# Patient Record
Sex: Male | Born: 1980 | Race: Black or African American | Hispanic: No | Smoking: Current every day smoker
Health system: Southern US, Community
[De-identification: ages and names within clinical notes are randomized; demographics above are authoritative.]

## PROBLEM LIST (undated history)

## (undated) DIAGNOSIS — S82202B Unspecified fracture of shaft of left tibia, initial encounter for open fracture type I or II: Secondary | ICD-10-CM

## (undated) DIAGNOSIS — I10 Essential (primary) hypertension: Secondary | ICD-10-CM

## (undated) DIAGNOSIS — W3400XA Accidental discharge from unspecified firearms or gun, initial encounter: Secondary | ICD-10-CM

## (undated) DIAGNOSIS — S42412A Displaced simple supracondylar fracture without intercondylar fracture of left humerus, initial encounter for closed fracture: Secondary | ICD-10-CM

## (undated) DIAGNOSIS — T07XXXA Unspecified multiple injuries, initial encounter: Secondary | ICD-10-CM

---
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
## 2006-08-07 ENCOUNTER — Emergency Department (HOSPITAL_COMMUNITY): Admission: EM | Admit: 2006-08-07 | Discharge: 2006-08-07 | Payer: Self-pay | Admitting: Emergency Medicine

## 2007-01-27 ENCOUNTER — Emergency Department (HOSPITAL_COMMUNITY): Admission: EM | Admit: 2007-01-27 | Discharge: 2007-01-27 | Payer: Self-pay | Admitting: Family Medicine

## 2009-11-28 ENCOUNTER — Emergency Department (HOSPITAL_COMMUNITY): Admission: EM | Admit: 2009-11-28 | Discharge: 2009-11-28 | Payer: Self-pay | Admitting: Family Medicine

## 2010-04-06 LAB — POCT I-STAT, CHEM 8
Creatinine, Ser: 1 mg/dL (ref 0.4–1.2)
Glucose, Bld: 108 mg/dL — ABNORMAL HIGH (ref 70–99)
HCT: 46 % (ref 36.0–46.0)
Hemoglobin: 15.6 g/dL — ABNORMAL HIGH (ref 12.0–15.0)
Sodium: 141 mEq/L (ref 135–145)
TCO2: 26 mmol/L (ref 0–100)

## 2010-04-06 LAB — POCT URINALYSIS DIPSTICK
Bilirubin Urine: NEGATIVE
Nitrite: NEGATIVE
Protein, ur: NEGATIVE mg/dL
pH: 7 (ref 5.0–8.0)

## 2010-05-24 ENCOUNTER — Emergency Department (HOSPITAL_COMMUNITY)
Admission: EM | Admit: 2010-05-24 | Discharge: 2010-05-24 | Disposition: A | Discharge status: Home / Self Care | Payer: No Typology Code available for payment source | Attending: Emergency Medicine | Admitting: Emergency Medicine

## 2010-05-24 DIAGNOSIS — M542 Cervicalgia: Secondary | ICD-10-CM | POA: Insufficient documentation

## 2010-05-24 DIAGNOSIS — M25519 Pain in unspecified shoulder: Secondary | ICD-10-CM | POA: Insufficient documentation

## 2010-05-24 DIAGNOSIS — S139XXA Sprain of joints and ligaments of unspecified parts of neck, initial encounter: Secondary | ICD-10-CM | POA: Insufficient documentation

## 2010-05-27 ENCOUNTER — Emergency Department (HOSPITAL_COMMUNITY): Payer: No Typology Code available for payment source

## 2010-05-27 ENCOUNTER — Emergency Department (HOSPITAL_COMMUNITY)
Admission: EM | Admit: 2010-05-27 | Discharge: 2010-05-27 | Disposition: A | Discharge status: Home / Self Care | Payer: No Typology Code available for payment source | Attending: Emergency Medicine | Admitting: Emergency Medicine

## 2010-05-27 DIAGNOSIS — M542 Cervicalgia: Secondary | ICD-10-CM | POA: Insufficient documentation

## 2010-05-27 DIAGNOSIS — R51 Headache: Secondary | ICD-10-CM | POA: Insufficient documentation

## 2010-05-27 DIAGNOSIS — S139XXA Sprain of joints and ligaments of unspecified parts of neck, initial encounter: Secondary | ICD-10-CM | POA: Insufficient documentation

## 2010-05-27 DIAGNOSIS — M545 Low back pain, unspecified: Secondary | ICD-10-CM | POA: Insufficient documentation

## 2010-05-27 DIAGNOSIS — M546 Pain in thoracic spine: Secondary | ICD-10-CM | POA: Insufficient documentation

## 2010-05-27 DIAGNOSIS — S239XXA Sprain of unspecified parts of thorax, initial encounter: Secondary | ICD-10-CM | POA: Insufficient documentation

## 2010-05-27 DIAGNOSIS — S335XXA Sprain of ligaments of lumbar spine, initial encounter: Secondary | ICD-10-CM | POA: Insufficient documentation

## 2010-06-03 ENCOUNTER — Emergency Department (HOSPITAL_COMMUNITY)
Admission: EM | Admit: 2010-06-03 | Discharge: 2010-06-03 | Disposition: A | Discharge status: Home / Self Care | Payer: No Typology Code available for payment source | Attending: Emergency Medicine | Admitting: Emergency Medicine

## 2010-06-03 DIAGNOSIS — Y929 Unspecified place or not applicable: Secondary | ICD-10-CM | POA: Insufficient documentation

## 2010-06-03 DIAGNOSIS — M542 Cervicalgia: Secondary | ICD-10-CM | POA: Insufficient documentation

## 2010-06-03 DIAGNOSIS — R51 Headache: Secondary | ICD-10-CM | POA: Insufficient documentation

## 2010-06-03 DIAGNOSIS — M546 Pain in thoracic spine: Secondary | ICD-10-CM | POA: Insufficient documentation

## 2010-06-03 DIAGNOSIS — IMO0001 Reserved for inherently not codable concepts without codable children: Secondary | ICD-10-CM | POA: Insufficient documentation

## 2010-07-06 ENCOUNTER — Emergency Department (HOSPITAL_COMMUNITY)
Admission: EM | Admit: 2010-07-06 | Discharge: 2010-07-06 | Disposition: A | Discharge status: Home / Self Care | Payer: Self-pay | Attending: Emergency Medicine | Admitting: Emergency Medicine

## 2010-07-06 ENCOUNTER — Emergency Department (HOSPITAL_COMMUNITY): Payer: Self-pay

## 2010-07-06 DIAGNOSIS — Y92009 Unspecified place in unspecified non-institutional (private) residence as the place of occurrence of the external cause: Secondary | ICD-10-CM | POA: Insufficient documentation

## 2010-07-06 DIAGNOSIS — IMO0001 Reserved for inherently not codable concepts without codable children: Secondary | ICD-10-CM | POA: Insufficient documentation

## 2010-07-06 DIAGNOSIS — S335XXA Sprain of ligaments of lumbar spine, initial encounter: Secondary | ICD-10-CM | POA: Insufficient documentation

## 2010-07-06 DIAGNOSIS — R51 Headache: Secondary | ICD-10-CM | POA: Insufficient documentation

## 2010-07-06 DIAGNOSIS — J3489 Other specified disorders of nose and nasal sinuses: Secondary | ICD-10-CM | POA: Insufficient documentation

## 2010-07-06 DIAGNOSIS — M545 Low back pain, unspecified: Secondary | ICD-10-CM | POA: Insufficient documentation

## 2010-07-06 DIAGNOSIS — R110 Nausea: Secondary | ICD-10-CM | POA: Insufficient documentation

## 2010-07-19 ENCOUNTER — Emergency Department (HOSPITAL_COMMUNITY)
Admission: EM | Admit: 2010-07-19 | Discharge: 2010-07-19 | Disposition: A | Discharge status: Home / Self Care | Payer: Self-pay | Attending: Emergency Medicine | Admitting: Emergency Medicine

## 2010-07-19 DIAGNOSIS — M542 Cervicalgia: Secondary | ICD-10-CM | POA: Insufficient documentation

## 2010-07-19 DIAGNOSIS — W08XXXA Fall from other furniture, initial encounter: Secondary | ICD-10-CM | POA: Insufficient documentation

## 2010-07-19 DIAGNOSIS — Y92009 Unspecified place in unspecified non-institutional (private) residence as the place of occurrence of the external cause: Secondary | ICD-10-CM | POA: Insufficient documentation

## 2010-07-19 DIAGNOSIS — M546 Pain in thoracic spine: Secondary | ICD-10-CM | POA: Insufficient documentation

## 2010-10-14 LAB — GC/CHLAMYDIA PROBE AMP, GENITAL: GC Probe Amp, Genital: NEGATIVE

## 2012-01-07 ENCOUNTER — Emergency Department (HOSPITAL_COMMUNITY): Payer: Medicaid Other

## 2012-01-07 ENCOUNTER — Inpatient Hospital Stay (HOSPITAL_COMMUNITY): Payer: Medicaid Other

## 2012-01-07 ENCOUNTER — Encounter (HOSPITAL_COMMUNITY): Admission: EM | Disposition: A | Discharge status: Home / Self Care | Payer: Self-pay | Source: Home / Self Care

## 2012-01-07 ENCOUNTER — Encounter (HOSPITAL_COMMUNITY): Payer: Self-pay | Admitting: Emergency Medicine

## 2012-01-07 ENCOUNTER — Encounter (HOSPITAL_COMMUNITY): Payer: Self-pay | Admitting: Orthopedic Surgery

## 2012-01-07 ENCOUNTER — Emergency Department (HOSPITAL_COMMUNITY): Payer: Medicaid Other | Admitting: Certified Registered Nurse Anesthetist

## 2012-01-07 ENCOUNTER — Encounter (HOSPITAL_COMMUNITY): Payer: Self-pay | Admitting: Certified Registered Nurse Anesthetist

## 2012-01-07 ENCOUNTER — Inpatient Hospital Stay (HOSPITAL_COMMUNITY)
Admission: EM | Admit: 2012-01-07 | Discharge: 2012-01-16 | DRG: 958 | Disposition: A | Discharge status: Home / Self Care | Payer: Medicaid Other | Attending: General Surgery | Admitting: General Surgery

## 2012-01-07 DIAGNOSIS — S41132A Puncture wound without foreign body of left upper arm, initial encounter: Secondary | ICD-10-CM

## 2012-01-07 DIAGNOSIS — S82209B Unspecified fracture of shaft of unspecified tibia, initial encounter for open fracture type I or II: Principal | ICD-10-CM | POA: Diagnosis present

## 2012-01-07 DIAGNOSIS — S52209A Unspecified fracture of shaft of unspecified ulna, initial encounter for closed fracture: Secondary | ICD-10-CM

## 2012-01-07 DIAGNOSIS — S42412A Displaced simple supracondylar fracture without intercondylar fracture of left humerus, initial encounter for closed fracture: Secondary | ICD-10-CM

## 2012-01-07 DIAGNOSIS — S71009A Unspecified open wound, unspecified hip, initial encounter: Secondary | ICD-10-CM | POA: Diagnosis present

## 2012-01-07 DIAGNOSIS — S82409A Unspecified fracture of shaft of unspecified fibula, initial encounter for closed fracture: Secondary | ICD-10-CM

## 2012-01-07 DIAGNOSIS — W3400XA Accidental discharge from unspecified firearms or gun, initial encounter: Secondary | ICD-10-CM

## 2012-01-07 DIAGNOSIS — S82209A Unspecified fracture of shaft of unspecified tibia, initial encounter for closed fracture: Secondary | ICD-10-CM

## 2012-01-07 DIAGNOSIS — S31109A Unspecified open wound of abdominal wall, unspecified quadrant without penetration into peritoneal cavity, initial encounter: Secondary | ICD-10-CM | POA: Diagnosis present

## 2012-01-07 DIAGNOSIS — D62 Acute posthemorrhagic anemia: Secondary | ICD-10-CM | POA: Diagnosis present

## 2012-01-07 DIAGNOSIS — S81832A Puncture wound without foreign body, left lower leg, initial encounter: Secondary | ICD-10-CM

## 2012-01-07 DIAGNOSIS — I10 Essential (primary) hypertension: Secondary | ICD-10-CM | POA: Diagnosis present

## 2012-01-07 DIAGNOSIS — S21339A Puncture wound without foreign body of unspecified front wall of thorax with penetration into thoracic cavity, initial encounter: Secondary | ICD-10-CM

## 2012-01-07 DIAGNOSIS — R209 Unspecified disturbances of skin sensation: Secondary | ICD-10-CM | POA: Diagnosis present

## 2012-01-07 DIAGNOSIS — S82899B Other fracture of unspecified lower leg, initial encounter for open fracture type I or II: Secondary | ICD-10-CM | POA: Diagnosis present

## 2012-01-07 DIAGNOSIS — S71109A Unspecified open wound, unspecified thigh, initial encounter: Secondary | ICD-10-CM | POA: Diagnosis present

## 2012-01-07 DIAGNOSIS — S8253XB Displaced fracture of medial malleolus of unspecified tibia, initial encounter for open fracture type I or II: Secondary | ICD-10-CM | POA: Diagnosis present

## 2012-01-07 DIAGNOSIS — S42402B Unspecified fracture of lower end of left humerus, initial encounter for open fracture: Secondary | ICD-10-CM

## 2012-01-07 DIAGNOSIS — T07XXXA Unspecified multiple injuries, initial encounter: Secondary | ICD-10-CM

## 2012-01-07 DIAGNOSIS — S21109A Unspecified open wound of unspecified front wall of thorax without penetration into thoracic cavity, initial encounter: Secondary | ICD-10-CM | POA: Diagnosis present

## 2012-01-07 DIAGNOSIS — T79A29A Traumatic compartment syndrome of unspecified lower extremity, initial encounter: Secondary | ICD-10-CM | POA: Diagnosis present

## 2012-01-07 DIAGNOSIS — S31030A Puncture wound without foreign body of lower back and pelvis without penetration into retroperitoneum, initial encounter: Secondary | ICD-10-CM

## 2012-01-07 DIAGNOSIS — S82202B Unspecified fracture of shaft of left tibia, initial encounter for open fracture type I or II: Secondary | ICD-10-CM

## 2012-01-07 DIAGNOSIS — S85909A Unspecified injury of unspecified blood vessel at lower leg level, unspecified leg, initial encounter: Secondary | ICD-10-CM

## 2012-01-07 DIAGNOSIS — S42413B Displaced simple supracondylar fracture without intercondylar fracture of unspecified humerus, initial encounter for open fracture: Secondary | ICD-10-CM | POA: Diagnosis present

## 2012-01-07 DIAGNOSIS — S82109B Unspecified fracture of upper end of unspecified tibia, initial encounter for open fracture type I or II: Secondary | ICD-10-CM | POA: Diagnosis present

## 2012-01-07 DIAGNOSIS — S3130XA Unspecified open wound of scrotum and testes, initial encounter: Secondary | ICD-10-CM | POA: Diagnosis present

## 2012-01-07 HISTORY — DX: Displaced simple supracondylar fracture without intercondylar fracture of left humerus, initial encounter for closed fracture: S42.412A

## 2012-01-07 HISTORY — PX: EXTERNAL FIXATION LEG: SHX1549

## 2012-01-07 HISTORY — DX: Unspecified fracture of shaft of left tibia, initial encounter for open fracture type I or II: S82.202B

## 2012-01-07 HISTORY — PX: INTRAOPERATIVE ARTERIOGRAM: SHX5157

## 2012-01-07 HISTORY — DX: Unspecified multiple injuries, initial encounter: T07.XXXA

## 2012-01-07 HISTORY — DX: Accidental discharge from unspecified firearms or gun, initial encounter: W34.00XA

## 2012-01-07 HISTORY — PX: I&D EXTREMITY: SHX5045

## 2012-01-07 HISTORY — PX: CAST APPLICATION: SHX380

## 2012-01-07 HISTORY — DX: Essential (primary) hypertension: I10

## 2012-01-07 LAB — COMPREHENSIVE METABOLIC PANEL
ALT: 10 U/L (ref 0–53)
AST: 19 U/L (ref 0–37)
Albumin: 4.1 g/dL (ref 3.5–5.2)
CO2: 16 mEq/L — ABNORMAL LOW (ref 19–32)
Chloride: 104 mEq/L (ref 96–112)
Creatinine, Ser: 1.03 mg/dL (ref 0.50–1.35)
GFR calc non Af Amer: >90 mL/min (ref >90)
Sodium: 139 mEq/L (ref 135–145)
Total Bilirubin: 0.3 mg/dL (ref 0.3–1.2)

## 2012-01-07 LAB — CBC
Hemoglobin: 13.7 g/dL (ref 13.0–17.0)
MCH: 27.8 pg (ref 26.0–34.0)
MCHC: 36.1 g/dL — ABNORMAL HIGH (ref 30.0–36.0)
MCV: 77 fL — ABNORMAL LOW (ref 78.0–100.0)
Platelets: 246 10*3/uL (ref 150–400)
RBC: 4.92 MIL/uL (ref 4.22–5.81)

## 2012-01-07 LAB — URINALYSIS, MICROSCOPIC ONLY
Bilirubin Urine: NEGATIVE
Glucose, UA: NEGATIVE mg/dL
Ketones, ur: NEGATIVE mg/dL
Protein, ur: NEGATIVE mg/dL
pH: 7 (ref 5.0–8.0)

## 2012-01-07 LAB — PROTIME-INR: INR: 1.09 (ref 0.00–1.49)

## 2012-01-07 LAB — POCT I-STAT, CHEM 8
BUN: 13 mg/dL (ref 6–23)
Chloride: 108 mEq/L (ref 96–112)
Creatinine, Ser: 0.9 mg/dL (ref 0.50–1.35)
Sodium: 141 mEq/L (ref 135–145)

## 2012-01-07 LAB — ABO/RH: ABO/RH(D): O POS

## 2012-01-07 LAB — CG4 I-STAT (LACTIC ACID): Lactic Acid, Venous: 6.84 mmol/L — ABNORMAL HIGH (ref 0.5–2.2)

## 2012-01-07 LAB — PREPARE RBC (CROSSMATCH)

## 2012-01-07 SURGERY — INTRA OPERATIVE ARTERIOGRAM
Anesthesia: General

## 2012-01-07 SURGERY — EXTERNAL FIXATION, LOWER EXTREMITY
Anesthesia: General | Site: Leg Lower | Laterality: Right | Wound class: Dirty or Infected

## 2012-01-07 MED ORDER — IODIXANOL 320 MG/ML IV SOLN
INTRAVENOUS | Status: DC | PRN
  Administered 2012-01-07: 60 mL via INTRAVENOUS

## 2012-01-07 MED ORDER — LIDOCAINE HCL (CARDIAC) 20 MG/ML IV SOLN
INTRAVENOUS | Status: DC | PRN
  Administered 2012-01-07: 100 mg via INTRAVENOUS

## 2012-01-07 MED ORDER — IOHEXOL 300 MG/ML  SOLN
100.0000 mL | Freq: Once | INTRAMUSCULAR | Status: AC | PRN
  Administered 2012-01-07: 100 mL via INTRAVENOUS

## 2012-01-07 MED ORDER — TETANUS-DIPHTH-ACELL PERTUSSIS 5-2.5-18.5 LF-MCG/0.5 IM SUSP
0.5000 mL | Freq: Once | INTRAMUSCULAR | Status: AC
  Administered 2012-01-07: 0.5 mL via INTRAMUSCULAR

## 2012-01-07 MED ORDER — PANTOPRAZOLE SODIUM 40 MG IV SOLR
40.0000 mg | Freq: Every day | INTRAVENOUS | Status: DC
  Administered 2012-01-07 – 2012-01-08 (×2): 40 mg via INTRAVENOUS
  Filled 2012-01-07 (×4): qty 40

## 2012-01-07 MED ORDER — METOCLOPRAMIDE HCL 5 MG/ML IJ SOLN: 10.0000 mg | Freq: Once | INTRAMUSCULAR | Status: DC | PRN

## 2012-01-07 MED ORDER — DEXTROSE 5 % IV SOLN
INTRAVENOUS | Status: DC | PRN
  Administered 2012-01-07: 09:00:00 via INTRAVENOUS

## 2012-01-07 MED ORDER — ACETAMINOPHEN 325 MG PO TABS: 650.0000 mg | ORAL_TABLET | ORAL | Status: DC | PRN

## 2012-01-07 MED ORDER — FENTANYL CITRATE 0.05 MG/ML IJ SOLN
50.0000 ug | Freq: Once | INTRAMUSCULAR | Status: DC
  Filled 2012-01-07: qty 2

## 2012-01-07 MED ORDER — LACTATED RINGERS IV SOLN
INTRAVENOUS | Status: DC | PRN
  Administered 2012-01-07 (×2): via INTRAVENOUS

## 2012-01-07 MED ORDER — TETANUS-DIPHTH-ACELL PERTUSSIS 5-2.5-18.5 LF-MCG/0.5 IM SUSP
INTRAMUSCULAR | Status: AC
  Administered 2012-01-07: 0.5 mL via INTRAMUSCULAR
  Filled 2012-01-07: qty 0.5

## 2012-01-07 MED ORDER — CEFAZOLIN SODIUM 1-5 GM-% IV SOLN
1.0000 g | Freq: Three times a day (TID) | INTRAVENOUS | Status: AC
  Administered 2012-01-07 – 2012-01-08 (×3): 1 g via INTRAVENOUS
  Filled 2012-01-07 (×4): qty 50

## 2012-01-07 MED ORDER — FENTANYL CITRATE 0.05 MG/ML IJ SOLN
INTRAMUSCULAR | Status: DC | PRN
  Administered 2012-01-07 (×2): 50 ug via INTRAVENOUS
  Administered 2012-01-07: 100 ug via INTRAVENOUS
  Administered 2012-01-07: 50 ug via INTRAVENOUS

## 2012-01-07 MED ORDER — KCL IN DEXTROSE-NACL 20-5-0.45 MEQ/L-%-% IV SOLN
INTRAVENOUS | Status: DC
  Administered 2012-01-07 – 2012-01-08 (×2): via INTRAVENOUS
  Filled 2012-01-07 (×6): qty 1000

## 2012-01-07 MED ORDER — FENTANYL CITRATE 0.05 MG/ML IJ SOLN
50.0000 ug | Freq: Once | INTRAMUSCULAR | Status: AC
  Administered 2012-01-07: 50 ug via INTRAVENOUS

## 2012-01-07 MED ORDER — ROCURONIUM BROMIDE 100 MG/10ML IV SOLN
INTRAVENOUS | Status: DC | PRN
  Administered 2012-01-07: 50 mg via INTRAVENOUS

## 2012-01-07 MED ORDER — HYDROMORPHONE HCL PF 1 MG/ML IJ SOLN
0.2500 mg | INTRAMUSCULAR | Status: DC | PRN
  Administered 2012-01-07 (×2): 0.5 mg via INTRAVENOUS

## 2012-01-07 MED ORDER — SODIUM CHLORIDE 0.9 % IJ SOLN
INTRAMUSCULAR | Status: AC
  Administered 2012-01-07: 10 mL
  Filled 2012-01-07: qty 10

## 2012-01-07 MED ORDER — CEFAZOLIN SODIUM-DEXTROSE 2-3 GM-% IV SOLR
2.0000 g | INTRAVENOUS | Status: AC
  Administered 2012-01-07: 2 g via INTRAVENOUS

## 2012-01-07 MED ORDER — LACTATED RINGERS IV SOLN
INTRAVENOUS | Status: DC | PRN
  Administered 2012-01-07 (×3): via INTRAVENOUS

## 2012-01-07 MED ORDER — HYDROMORPHONE HCL PF 1 MG/ML IJ SOLN
0.5000 mg | INTRAMUSCULAR | Status: DC | PRN
  Administered 2012-01-07 (×3): 1 mg via INTRAVENOUS
  Administered 2012-01-08: 0.5 mg via INTRAVENOUS
  Administered 2012-01-08 (×2): 1 mg via INTRAVENOUS
  Administered 2012-01-08: 2 mg via INTRAVENOUS
  Administered 2012-01-08 – 2012-01-09 (×4): 1 mg via INTRAVENOUS
  Filled 2012-01-07 (×6): qty 1
  Filled 2012-01-07: qty 2
  Filled 2012-01-07 (×4): qty 1

## 2012-01-07 MED ORDER — MORPHINE SULFATE 4 MG/ML IJ SOLN
5.0000 mg | Freq: Once | INTRAMUSCULAR | Status: AC
  Administered 2012-01-07: 5 mg via INTRAVENOUS
  Filled 2012-01-07 (×2): qty 1

## 2012-01-07 MED ORDER — ONDANSETRON HCL 4 MG/2ML IJ SOLN
4.0000 mg | Freq: Once | INTRAMUSCULAR | Status: AC
  Administered 2012-01-07: 4 mg via INTRAVENOUS
  Filled 2012-01-07: qty 2

## 2012-01-07 MED ORDER — MIDAZOLAM HCL 5 MG/5ML IJ SOLN
INTRAMUSCULAR | Status: DC | PRN
  Administered 2012-01-07: 2 mg via INTRAVENOUS

## 2012-01-07 MED ORDER — FENTANYL CITRATE 0.05 MG/ML IJ SOLN
INTRAMUSCULAR | Status: AC
  Filled 2012-01-07: qty 2

## 2012-01-07 MED ORDER — SUCCINYLCHOLINE CHLORIDE 20 MG/ML IJ SOLN
INTRAMUSCULAR | Status: DC | PRN
  Administered 2012-01-07: 100 mg via INTRAVENOUS

## 2012-01-07 MED ORDER — ONDANSETRON HCL 4 MG/2ML IJ SOLN
4.0000 mg | Freq: Four times a day (QID) | INTRAMUSCULAR | Status: DC | PRN
  Administered 2012-01-07 – 2012-01-12 (×4): 4 mg via INTRAVENOUS
  Filled 2012-01-07 (×4): qty 2

## 2012-01-07 MED ORDER — SODIUM CHLORIDE 0.9 % IR SOLN
Status: DC | PRN
  Administered 2012-01-07: 12:00:00

## 2012-01-07 MED ORDER — CEFAZOLIN SODIUM 1-5 GM-% IV SOLN
1.0000 g | Freq: Once | INTRAVENOUS | Status: AC
  Administered 2012-01-07: 1 g via INTRAVENOUS
  Filled 2012-01-07: qty 50

## 2012-01-07 MED ORDER — NEOSTIGMINE METHYLSULFATE 1 MG/ML IJ SOLN
INTRAMUSCULAR | Status: DC | PRN
  Administered 2012-01-07: 3 mg via INTRAVENOUS

## 2012-01-07 MED ORDER — GLYCOPYRROLATE 0.2 MG/ML IJ SOLN
INTRAMUSCULAR | Status: DC | PRN
  Administered 2012-01-07: 0.4 mg via INTRAVENOUS

## 2012-01-07 MED ORDER — ONDANSETRON HCL 4 MG/2ML IJ SOLN
INTRAMUSCULAR | Status: DC | PRN
  Administered 2012-01-07: 4 mg via INTRAVENOUS

## 2012-01-07 MED ORDER — PANTOPRAZOLE SODIUM 40 MG PO TBEC: 40.0000 mg | DELAYED_RELEASE_TABLET | Freq: Every day | ORAL | Status: DC

## 2012-01-07 MED ORDER — SODIUM CHLORIDE 0.9 % IR SOLN
Status: DC | PRN
  Administered 2012-01-07: 3000 mL

## 2012-01-07 MED ORDER — SODIUM CHLORIDE 0.9 % IV SOLN
3.0000 g | Freq: Once | INTRAVENOUS | Status: AC
  Administered 2012-01-07: 3 g via INTRAVENOUS
  Filled 2012-01-07: qty 3

## 2012-01-07 MED ORDER — GENTAMICIN SULFATE 40 MG/ML IJ SOLN
630.0000 mg | Freq: Once | INTRAVENOUS | Status: AC
  Administered 2012-01-07: 630 mg via INTRAVENOUS
  Filled 2012-01-07: qty 15.75

## 2012-01-07 MED ORDER — PROPOFOL 10 MG/ML IV BOLUS
INTRAVENOUS | Status: DC | PRN
  Administered 2012-01-07: 200 mg via INTRAVENOUS

## 2012-01-07 MED ORDER — ONDANSETRON HCL 4 MG PO TABS
4.0000 mg | ORAL_TABLET | Freq: Four times a day (QID) | ORAL | Status: DC | PRN
  Filled 2012-01-07: qty 1

## 2012-01-07 SURGICAL SUPPLY — 89 items
BANDAGE ELASTIC 4 VELCRO ST LF (GAUZE/BANDAGES/DRESSINGS) ×8 IMPLANT
BANDAGE ELASTIC 6 VELCRO ST LF (GAUZE/BANDAGES/DRESSINGS) ×8 IMPLANT
BANDAGE ESMARK 6X9 LF (GAUZE/BANDAGES/DRESSINGS) ×3 IMPLANT
BANDAGE GAUZE ELAST BULKY 4 IN (GAUZE/BANDAGES/DRESSINGS) ×8 IMPLANT
BLADE SURG 10 STRL SS (BLADE) ×4 IMPLANT
BNDG CMPR 9X6 STRL LF SNTH (GAUZE/BANDAGES/DRESSINGS) ×3
BNDG COHESIVE 4X5 TAN STRL (GAUZE/BANDAGES/DRESSINGS) ×4 IMPLANT
BNDG COHESIVE 6X5 TAN STRL LF (GAUZE/BANDAGES/DRESSINGS) ×4 IMPLANT
BNDG ESMARK 6X9 LF (GAUZE/BANDAGES/DRESSINGS) ×4
BOOTCOVER CLEANROOM LRG (PROTECTIVE WEAR) ×8 IMPLANT
BUCKET CAST 5QT PAPER WAX WHT (CAST SUPPLIES) ×4 IMPLANT
CATH OMNI FLUSH .035X70CM (CATHETERS) ×4 IMPLANT
CATH STRAIGHT 5FR 65CM (CATHETERS) ×8 IMPLANT
CLAMP LG COMBINATION (Clamp) ×16 IMPLANT
CLEANER TIP ELECTROSURG 2X2 (MISCELLANEOUS) ×4 IMPLANT
CLOTH BEACON ORANGE TIMEOUT ST (SAFETY) ×4 IMPLANT
CONNECTOR Y ATS VAC SYSTEM (MISCELLANEOUS) ×4 IMPLANT
COVER SURGICAL LIGHT HANDLE (MISCELLANEOUS) ×8 IMPLANT
CUFF TOURNIQUET SINGLE 18IN (TOURNIQUET CUFF) IMPLANT
CUFF TOURNIQUET SINGLE 24IN (TOURNIQUET CUFF) IMPLANT
CUFF TOURNIQUET SINGLE 34IN LL (TOURNIQUET CUFF) IMPLANT
DRAPE C-ARM 42X72 X-RAY (DRAPES) IMPLANT
DRAPE C-ARMOR (DRAPES) ×4 IMPLANT
DRAPE U-SHAPE 47X51 STRL (DRAPES) ×4 IMPLANT
DRSG ADAPTIC 3X8 NADH LF (GAUZE/BANDAGES/DRESSINGS) ×4 IMPLANT
DRSG MEPITEL 4X7.2 (GAUZE/BANDAGES/DRESSINGS) ×4 IMPLANT
DRSG PAD ABDOMINAL 8X10 ST (GAUZE/BANDAGES/DRESSINGS) ×4 IMPLANT
DRSG VAC ATS LRG SENSATRAC (GAUZE/BANDAGES/DRESSINGS) ×12 IMPLANT
DURAPREP 26ML APPLICATOR (WOUND CARE) ×4 IMPLANT
ELECT REM PT RETURN 9FT ADLT (ELECTROSURGICAL) ×4
ELECTRODE REM PT RTRN 9FT ADLT (ELECTROSURGICAL) ×3 IMPLANT
EVACUATOR 1/8 PVC DRAIN (DRAIN) IMPLANT
GAUZE XEROFORM 1X8 LF (GAUZE/BANDAGES/DRESSINGS) ×4 IMPLANT
GAUZE XEROFORM 5X9 LF (GAUZE/BANDAGES/DRESSINGS) ×4 IMPLANT
GLOVE BIO SURGEON STRL SZ7 (GLOVE) ×8 IMPLANT
GLOVE BIOGEL PI IND STRL 6 (GLOVE) ×12 IMPLANT
GLOVE BIOGEL PI IND STRL 7.0 (GLOVE) ×9 IMPLANT
GLOVE BIOGEL PI IND STRL 8 (GLOVE) ×9 IMPLANT
GLOVE BIOGEL PI INDICATOR 6 (GLOVE) ×4
GLOVE BIOGEL PI INDICATOR 7.0 (GLOVE) ×3
GLOVE BIOGEL PI INDICATOR 8 (GLOVE) ×3
GLOVE ORTHO TXT STRL SZ7.5 (GLOVE) ×8 IMPLANT
GLOVE SURG ORTHO 8.0 STRL STRW (GLOVE) ×12 IMPLANT
GOWN STRL NON-REIN LRG LVL3 (GOWN DISPOSABLE) ×16 IMPLANT
HANDPIECE INTERPULSE COAX TIP (DISPOSABLE) ×4
KIT BASIN OR (CUSTOM PROCEDURE TRAY) ×8 IMPLANT
KIT ROOM TURNOVER OR (KITS) ×4 IMPLANT
MANIFOLD NEPTUNE II (INSTRUMENTS) ×4 IMPLANT
NEEDLE 22X1 1/2 (OR ONLY) (NEEDLE) IMPLANT
NEEDLE PERC 18GX7CM (NEEDLE) ×4 IMPLANT
NS IRRIG 1000ML POUR BTL (IV SOLUTION) ×4 IMPLANT
PACK ORTHO EXTREMITY (CUSTOM PROCEDURE TRAY) ×4 IMPLANT
PACK PERIPHERAL VASCULAR (CUSTOM PROCEDURE TRAY) ×4 IMPLANT
PAD ARMBOARD 7.5X6 YLW CONV (MISCELLANEOUS) ×8 IMPLANT
PAD NEG PRESSURE SENSATRAC (MISCELLANEOUS) ×4 IMPLANT
PADDING CAST ABS 4INX4YD NS (CAST SUPPLIES) ×1
PADDING CAST ABS COTTON 4X4 ST (CAST SUPPLIES) ×3 IMPLANT
PADDING CAST COTTON 6X4 STRL (CAST SUPPLIES) ×12 IMPLANT
ROD CARBON FIBER 450MM (Rod) ×8 IMPLANT
SET HNDPC FAN SPRY TIP SCT (DISPOSABLE) ×3 IMPLANT
SHEATH AVANTI 11CM 5FR (MISCELLANEOUS) ×4 IMPLANT
SPLINT PLASTER CAST XFAST 5X30 (CAST SUPPLIES) ×3 IMPLANT
SPLINT PLASTER XFAST SET 5X30 (CAST SUPPLIES) ×1
SPONGE GAUZE 4X4 12PLY (GAUZE/BANDAGES/DRESSINGS) ×12 IMPLANT
SPONGE LAP 18X18 X RAY DECT (DISPOSABLE) ×12 IMPLANT
SPONGE SCRUB IODOPHOR (GAUZE/BANDAGES/DRESSINGS) ×4 IMPLANT
STAPLER VISISTAT 35W (STAPLE) IMPLANT
STOCKINETTE IMPERVIOUS 9X36 MD (GAUZE/BANDAGES/DRESSINGS) ×4 IMPLANT
STOCKINETTE IMPERVIOUS LG (DRAPES) ×4 IMPLANT
SUCTION FRAZIER TIP 10 FR DISP (SUCTIONS) IMPLANT
SUT ETHILON 3 0 PS 1 (SUTURE) IMPLANT
SUT SILK 2 0 (SUTURE) ×3
SUT SILK 2-0 18XBRD TIE 12 (SUTURE) ×3 IMPLANT
SUT SILK 3 0 (SUTURE) ×3
SUT SILK 3-0 18XBRD TIE 12 (SUTURE) ×3 IMPLANT
SUT VIC AB 0 CT1 27 (SUTURE) ×6
SUT VIC AB 0 CT1 27XBRD ANBCTR (SUTURE) ×6 IMPLANT
SUT VIC AB 2-0 CT1 27 (SUTURE) ×8
SUT VIC AB 2-0 CT1 TAPERPNT 27 (SUTURE) ×6 IMPLANT
SYR CONTROL 10ML LL (SYRINGE) IMPLANT
TAPE CLOTH SURG 4X10 WHT LF (GAUZE/BANDAGES/DRESSINGS) ×4 IMPLANT
TOWEL OR 17X24 6PK STRL BLUE (TOWEL DISPOSABLE) ×12 IMPLANT
TOWEL OR 17X26 10 PK STRL BLUE (TOWEL DISPOSABLE) ×12 IMPLANT
TUBE ANAEROBIC SPECIMEN COL (MISCELLANEOUS) IMPLANT
TUBE CONNECTING 12X1/4 (SUCTIONS) ×4 IMPLANT
Trasfixation Pin ×8 IMPLANT
UNDERPAD 30X30 INCONTINENT (UNDERPADS AND DIAPERS) ×4 IMPLANT
WATER STERILE IRR 1000ML POUR (IV SOLUTION) ×8 IMPLANT
YANKAUER SUCT BULB TIP NO VENT (SUCTIONS) ×8 IMPLANT

## 2012-01-07 NOTE — ED Notes (Addendum)
Pt mother contacted at [phone] and states that she is on her way.
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
## 2012-01-07 NOTE — Anesthesia Procedure Notes (Signed)
Procedure Name: Intubation Date/Time: 01/07/2012 8:38 AM Performed by: Tyrone Nine Pre-anesthesia Checklist: Patient identified, Timeout performed, Emergency Drugs available, Patient being monitored and Suction available Patient Re-evaluated:Patient Re-evaluated prior to inductionOxygen Delivery Method: Circle system utilized Preoxygenation: Pre-oxygenation with 100% oxygen Intubation Type: IV induction, Rapid sequence and Cricoid Pressure applied Laryngoscope Size: Mac and 3 Grade View: Grade I Tube type: Oral Tube size: 7.5 mm Number of attempts: 1 Airway Equipment and Method: Stylet Placement Confirmation: ETT inserted through vocal cords under direct vision,  positive ETCO2 and breath sounds checked- equal and bilateral Secured at: 23 cm Tube secured with: Tape Dental Injury: Teeth and Oropharynx as per pre-operative assessment
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
## 2012-01-07 NOTE — ED Notes (Signed)
Ortho paged and responded.  Speaking with Dr Dwain Sarna.  Mother notified by Rapid Response per pt requst.
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
## 2012-01-07 NOTE — ED Notes (Signed)
2nd unit of emergency release blood  Started. Verified per Christa RN and Zella Ball RN. Rate 
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
## 2012-01-07 NOTE — Transfer of Care (Signed)
Immediate Anesthesia Transfer of Care Note  Patient: Jacob Alvarado  Procedure(s) Performed: Procedure(s) (LRB) with comments: EXTERNAL FIXATION LEG (Left) - External Fixation Tibia.End of Procedure: 10:53 IRRIGATION AND DEBRIDEMENT EXTREMITY (Left) - Irrigation and Debridement with fore compartment Fasciotomy. Application of wound vac. CAST APPLICATION (Left) - cast placed on whole arm.  Start -08:50-end-0907 INTRA OPERATIVE ARTERIOGRAM (Right) - start 11:37.Canulation of Right common femoral artery, for Left lower extrimity arteriogram.  Patient Location: PACU  Anesthesia Type:General  Level of Consciousness: awake, alert , oriented and patient cooperative  Airway & Oxygen Therapy: Patient Spontanous Breathing and Patient connected to nasal cannula oxygen  Post-op Assessment: Report given to PACU RN and Post -op Vital signs reviewed and stable  Post vital signs: Reviewed and stable  Complications: No apparent anesthesia complications
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
## 2012-01-07 NOTE — ED Notes (Signed)
Family at bedside (Mother)
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
## 2012-01-07 NOTE — ED Notes (Signed)
unasyn started.
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
## 2012-01-07 NOTE — ED Notes (Signed)
One unit of emergency release blood started at 98ml/hr. Verified by woody RN and HEATHER RN

## 2012-01-07 NOTE — ED Notes (Signed)
Dr Lindie Spruce at [facility]. + pedal pulse to left foot per doppler.

## 2012-01-07 NOTE — ED Notes (Signed)
Ortho tech in CT. Applied splint to left leg.

## 2012-01-07 NOTE — Anesthesia Postprocedure Evaluation (Signed)
Anesthesia Post Note  Patient: Jacob Alvarado  Procedure(s) Performed: Procedure(s) (LRB): EXTERNAL FIXATION LEG (Left) IRRIGATION AND DEBRIDEMENT EXTREMITY (Left) CAST APPLICATION (Left) INTRA OPERATIVE ARTERIOGRAM (Right)  Anesthesia type: general  Patient location: PACU  Post pain: Pain level controlled  Post assessment: Patient's Cardiovascular Status Stable  Last Vitals:  Filed Vitals:   01/07/12 1239  BP: 152/85  Pulse: 91  Temp: 36.4 C  Resp: 15    Post vital signs: Reviewed and stable  Level of consciousness: sedated  Complications: No apparent anesthesia complications

## 2012-01-07 NOTE — ED Notes (Signed)
Back from CT

## 2012-01-07 NOTE — ED Notes (Signed)
Pt arrived in CT scan.  

## 2012-01-07 NOTE — Preoperative (Signed)
Beta Blockers   Reason not to administer Beta Blockers:Not Applicable 

## 2012-01-07 NOTE — ED Notes (Signed)
Vital signs stable. 

## 2012-01-07 NOTE — ED Notes (Signed)
Fentanyl IV.

## 2012-01-08 ENCOUNTER — Inpatient Hospital Stay (HOSPITAL_COMMUNITY): Payer: Medicaid Other

## 2012-01-08 ENCOUNTER — Encounter (HOSPITAL_COMMUNITY): Payer: Self-pay | Admitting: Certified Registered Nurse Anesthetist

## 2012-01-08 ENCOUNTER — Encounter (HOSPITAL_COMMUNITY): Admission: EM | Disposition: A | Discharge status: Home / Self Care | Payer: Self-pay | Source: Home / Self Care

## 2012-01-08 ENCOUNTER — Inpatient Hospital Stay (HOSPITAL_COMMUNITY): Payer: Medicaid Other | Admitting: Certified Registered Nurse Anesthetist

## 2012-01-08 HISTORY — PX: ORIF ELBOW FRACTURE: SHX5031

## 2012-01-08 HISTORY — PX: ULNAR NERVE TRANSPOSITION: SHX2595

## 2012-01-08 LAB — BASIC METABOLIC PANEL
CO2: 26 mEq/L (ref 19–32)
Calcium: 7.5 mg/dL — ABNORMAL LOW (ref 8.4–10.5)
Creatinine, Ser: 1 mg/dL (ref 0.50–1.35)
GFR calc Af Amer: >90 mL/min (ref >90)
GFR calc non Af Amer: >90 mL/min (ref >90)
Sodium: 135 mEq/L (ref 135–145)

## 2012-01-08 LAB — CBC
MCH: 27.7 pg (ref 26.0–34.0)
MCHC: 35.6 g/dL (ref 30.0–36.0)
MCV: 77.7 fL — ABNORMAL LOW (ref 78.0–100.0)
Platelets: 122 10*3/uL — ABNORMAL LOW (ref 150–400)
RDW: 14.4 % (ref 11.5–15.5)

## 2012-01-08 SURGERY — OPEN REDUCTION INTERNAL FIXATION (ORIF) ELBOW/OLECRANON FRACTURE
Anesthesia: General | Site: Elbow | Laterality: Left | Wound class: Contaminated

## 2012-01-08 MED ORDER — METOCLOPRAMIDE HCL 5 MG/ML IJ SOLN
5.0000 mg | Freq: Three times a day (TID) | INTRAMUSCULAR | Status: DC | PRN
  Filled 2012-01-08: qty 2

## 2012-01-08 MED ORDER — ROCURONIUM BROMIDE 100 MG/10ML IV SOLN
INTRAVENOUS | Status: DC | PRN
  Administered 2012-01-08: 50 mg via INTRAVENOUS

## 2012-01-08 MED ORDER — ENOXAPARIN SODIUM 40 MG/0.4ML ~~LOC~~ SOLN
40.0000 mg | SUBCUTANEOUS | Status: DC
  Administered 2012-01-09: 40 mg via SUBCUTANEOUS
  Filled 2012-01-08 (×2): qty 0.4

## 2012-01-08 MED ORDER — METHOCARBAMOL 100 MG/ML IJ SOLN
500.0000 mg | Freq: Four times a day (QID) | INTRAVENOUS | Status: DC | PRN
  Administered 2012-01-09 – 2012-01-10 (×3): 500 mg via INTRAVENOUS
  Filled 2012-01-08 (×6): qty 5

## 2012-01-08 MED ORDER — SORBITOL 70 % SOLN
30.0000 mL | Freq: Every day | Status: DC | PRN
  Filled 2012-01-08: qty 30

## 2012-01-08 MED ORDER — CEFAZOLIN SODIUM 1-5 GM-% IV SOLN
INTRAVENOUS | Status: AC
  Filled 2012-01-08: qty 50

## 2012-01-08 MED ORDER — VECURONIUM BROMIDE 10 MG IV SOLR
INTRAVENOUS | Status: DC | PRN
  Administered 2012-01-08: 2 mg via INTRAVENOUS
  Administered 2012-01-08 (×5): 1 mg via INTRAVENOUS

## 2012-01-08 MED ORDER — ALBUMIN HUMAN 5 % IV SOLN
INTRAVENOUS | Status: DC | PRN
  Administered 2012-01-08 (×2): via INTRAVENOUS

## 2012-01-08 MED ORDER — CEFAZOLIN SODIUM 1-5 GM-% IV SOLN
1.0000 g | Freq: Once | INTRAVENOUS | Status: AC
  Administered 2012-01-08 (×2): 1 g via INTRAVENOUS
  Filled 2012-01-08: qty 50

## 2012-01-08 MED ORDER — HYDROMORPHONE HCL PF 1 MG/ML IJ SOLN
0.2500 mg | INTRAMUSCULAR | Status: DC | PRN
  Administered 2012-01-08 (×2): 0.5 mg via INTRAVENOUS

## 2012-01-08 MED ORDER — MIDAZOLAM HCL 5 MG/5ML IJ SOLN
INTRAMUSCULAR | Status: DC | PRN
  Administered 2012-01-08: 2 mg via INTRAVENOUS

## 2012-01-08 MED ORDER — DOCUSATE SODIUM 100 MG PO CAPS
100.0000 mg | ORAL_CAPSULE | Freq: Two times a day (BID) | ORAL | Status: DC
  Administered 2012-01-09 – 2012-01-14 (×9): 100 mg via ORAL
  Filled 2012-01-08 (×11): qty 1

## 2012-01-08 MED ORDER — HYDROMORPHONE HCL PF 1 MG/ML IJ SOLN
INTRAMUSCULAR | Status: AC
  Filled 2012-01-08: qty 1

## 2012-01-08 MED ORDER — CEFAZOLIN SODIUM-DEXTROSE 2-3 GM-% IV SOLR
2.0000 g | Freq: Four times a day (QID) | INTRAVENOUS | Status: AC
  Administered 2012-01-08 – 2012-01-09 (×3): 2 g via INTRAVENOUS
  Filled 2012-01-08 (×3): qty 50

## 2012-01-08 MED ORDER — PROPOFOL 10 MG/ML IV BOLUS
INTRAVENOUS | Status: DC | PRN
  Administered 2012-01-08: 200 mg via INTRAVENOUS

## 2012-01-08 MED ORDER — NEOSTIGMINE METHYLSULFATE 1 MG/ML IJ SOLN
INTRAMUSCULAR | Status: DC | PRN
  Administered 2012-01-08: 4 mg via INTRAVENOUS

## 2012-01-08 MED ORDER — LIDOCAINE HCL (CARDIAC) 20 MG/ML IV SOLN
INTRAVENOUS | Status: DC | PRN
  Administered 2012-01-08: 80 mg via INTRAVENOUS

## 2012-01-08 MED ORDER — POTASSIUM CHLORIDE IN NACL 20-0.45 MEQ/L-% IV SOLN
INTRAVENOUS | Status: DC
  Administered 2012-01-08: 17:00:00 via INTRAVENOUS
  Filled 2012-01-08 (×3): qty 1000

## 2012-01-08 MED ORDER — LACTATED RINGERS IV SOLN
INTRAVENOUS | Status: DC | PRN
  Administered 2012-01-08 (×4): via INTRAVENOUS

## 2012-01-08 MED ORDER — PHENYLEPHRINE HCL 10 MG/ML IJ SOLN
INTRAMUSCULAR | Status: DC | PRN
  Administered 2012-01-08 (×3): 40 ug via INTRAVENOUS
  Administered 2012-01-08: 80 ug via INTRAVENOUS

## 2012-01-08 MED ORDER — GENTAMICIN SULFATE 40 MG/ML IJ SOLN
630.0000 mg | INTRAVENOUS | Status: DC
  Administered 2012-01-08: 630 mg via INTRAVENOUS
  Filled 2012-01-08 (×2): qty 15.75

## 2012-01-08 MED ORDER — SENNA 8.6 MG PO TABS
1.0000 | ORAL_TABLET | Freq: Two times a day (BID) | ORAL | Status: DC
  Administered 2012-01-09 – 2012-01-16 (×12): 8.6 mg via ORAL
  Filled 2012-01-08 (×17): qty 1

## 2012-01-08 MED ORDER — FENTANYL CITRATE 0.05 MG/ML IJ SOLN
INTRAMUSCULAR | Status: DC | PRN
  Administered 2012-01-08 (×2): 50 ug via INTRAVENOUS
  Administered 2012-01-08: 100 ug via INTRAVENOUS
  Administered 2012-01-08 (×3): 50 ug via INTRAVENOUS

## 2012-01-08 MED ORDER — ONDANSETRON HCL 4 MG PO TABS: 4.0000 mg | ORAL_TABLET | Freq: Four times a day (QID) | ORAL | Status: DC | PRN

## 2012-01-08 MED ORDER — DIPHENHYDRAMINE HCL 12.5 MG/5ML PO ELIX
12.5000 mg | ORAL_SOLUTION | ORAL | Status: DC | PRN
  Filled 2012-01-08: qty 10

## 2012-01-08 MED ORDER — SODIUM CHLORIDE 0.9 % IV BOLUS (SEPSIS)
1000.0000 mL | Freq: Once | INTRAVENOUS | Status: AC
  Administered 2012-01-08: 1000 mL via INTRAVENOUS

## 2012-01-08 MED ORDER — SUCCINYLCHOLINE CHLORIDE 20 MG/ML IJ SOLN
INTRAMUSCULAR | Status: DC | PRN
  Administered 2012-01-08: 120 mg via INTRAVENOUS

## 2012-01-08 MED ORDER — POLYETHYLENE GLYCOL 3350 17 G PO PACK
17.0000 g | PACK | Freq: Every day | ORAL | Status: DC | PRN
  Filled 2012-01-08 (×2): qty 1

## 2012-01-08 MED ORDER — ONDANSETRON HCL 4 MG/2ML IJ SOLN
INTRAMUSCULAR | Status: DC | PRN
  Administered 2012-01-08: 4 mg via INTRAVENOUS

## 2012-01-08 MED ORDER — CEFAZOLIN SODIUM 1-5 GM-% IV SOLN
1.0000 g | Freq: Once | INTRAVENOUS | Status: DC
  Filled 2012-01-08: qty 50

## 2012-01-08 MED ORDER — METHOCARBAMOL 500 MG PO TABS
500.0000 mg | ORAL_TABLET | Freq: Four times a day (QID) | ORAL | Status: DC | PRN
  Administered 2012-01-11: 500 mg via ORAL
  Filled 2012-01-08 (×2): qty 1

## 2012-01-08 MED ORDER — ONDANSETRON HCL 4 MG/2ML IJ SOLN: 4.0000 mg | Freq: Four times a day (QID) | INTRAMUSCULAR | Status: DC | PRN

## 2012-01-08 MED ORDER — ZOLPIDEM TARTRATE 5 MG PO TABS: 5.0000 mg | ORAL_TABLET | Freq: Every evening | ORAL | Status: DC | PRN

## 2012-01-08 MED ORDER — GLYCOPYRROLATE 0.2 MG/ML IJ SOLN
INTRAMUSCULAR | Status: DC | PRN
  Administered 2012-01-08: .7 mg via INTRAVENOUS

## 2012-01-08 MED ORDER — METOCLOPRAMIDE HCL 5 MG PO TABS
5.0000 mg | ORAL_TABLET | Freq: Three times a day (TID) | ORAL | Status: DC | PRN
  Filled 2012-01-08: qty 2

## 2012-01-08 SURGICAL SUPPLY — 94 items
APL SKNCLS STERI-STRIP NONHPOA (GAUZE/BANDAGES/DRESSINGS) ×1
BANDAGE ELASTIC 3 VELCRO ST LF (GAUZE/BANDAGES/DRESSINGS) IMPLANT
BANDAGE ELASTIC 4 VELCRO ST LF (GAUZE/BANDAGES/DRESSINGS) IMPLANT
BANDAGE ELASTIC 6 VELCRO ST LF (GAUZE/BANDAGES/DRESSINGS) ×2 IMPLANT
BENZOIN TINCTURE PRP APPL 2/3 (GAUZE/BANDAGES/DRESSINGS) ×2 IMPLANT
BIT DRILL 2.5X2.75 QC CALB (BIT) ×2 IMPLANT
BIT DRILL CALIBRATED 2.7 (BIT) ×2 IMPLANT
BLADE AVERAGE 25X9 (BLADE) ×2 IMPLANT
BNDG CMPR 9X4 STRL LF SNTH (GAUZE/BANDAGES/DRESSINGS) ×1
BNDG CMPR MD 5X2 ELC HKLP STRL (GAUZE/BANDAGES/DRESSINGS)
BNDG COHESIVE 4X5 TAN STRL (GAUZE/BANDAGES/DRESSINGS) IMPLANT
BNDG ELASTIC 2 VLCR STRL LF (GAUZE/BANDAGES/DRESSINGS) IMPLANT
BNDG ESMARK 4X9 LF (GAUZE/BANDAGES/DRESSINGS) ×2 IMPLANT
CANISTER SUCTION 2500CC (MISCELLANEOUS) ×2 IMPLANT
CLOTH BEACON ORANGE TIMEOUT ST (SAFETY) ×2 IMPLANT
COVER SURGICAL LIGHT HANDLE (MISCELLANEOUS) ×2 IMPLANT
CUFF TOURNIQUET SINGLE 18IN (TOURNIQUET CUFF) IMPLANT
CUFF TOURNIQUET SINGLE 24IN (TOURNIQUET CUFF) IMPLANT
DRAPE C-ARM 42X72 X-RAY (DRAPES) ×2 IMPLANT
DRAPE INCISE IOBAN 66X45 STRL (DRAPES) ×2 IMPLANT
DRAPE OEC MINIVIEW 54X84 (DRAPES) ×2 IMPLANT
DRAPE U-SHAPE 47X51 STRL (DRAPES) ×6 IMPLANT
DRSG PAD ABDOMINAL 8X10 ST (GAUZE/BANDAGES/DRESSINGS) ×2 IMPLANT
DURAPREP 26ML APPLICATOR (WOUND CARE) ×2 IMPLANT
ELECT REM PT RETURN 9FT ADLT (ELECTROSURGICAL) ×2
ELECTRODE REM PT RTRN 9FT ADLT (ELECTROSURGICAL) ×1 IMPLANT
GAUZE XEROFORM 1X8 LF (GAUZE/BANDAGES/DRESSINGS) ×2 IMPLANT
GLOVE BIOGEL PI IND STRL 8 (GLOVE) ×1 IMPLANT
GLOVE BIOGEL PI INDICATOR 8 (GLOVE) ×1
GLOVE ORTHO TXT STRL SZ7.5 (GLOVE) ×4 IMPLANT
GLOVE SURG ORTHO 8.0 STRL STRW (GLOVE) ×4 IMPLANT
GOWN STRL NON-REIN LRG LVL3 (GOWN DISPOSABLE) IMPLANT
IV CATH 14GX2 1/4 (CATHETERS) ×2 IMPLANT
K-WIRE ACE 1.6X6 (WIRE) ×4
KIT BASIN OR (CUSTOM PROCEDURE TRAY) ×2 IMPLANT
KIT ROOM TURNOVER OR (KITS) ×2 IMPLANT
KWIRE ACE 1.6X6 (WIRE) ×2 IMPLANT
LOOP VESSEL MAXI BLUE (MISCELLANEOUS) ×2 IMPLANT
NEEDLE HYPO 25X1 1.5 SAFETY (NEEDLE) IMPLANT
NS IRRIG 1000ML POUR BTL (IV SOLUTION) ×2 IMPLANT
PACK ORTHO EXTREMITY (CUSTOM PROCEDURE TRAY) ×2 IMPLANT
PAD ARMBOARD 7.5X6 YLW CONV (MISCELLANEOUS) ×4 IMPLANT
PAD CAST 3X4 CTTN HI CHSV (CAST SUPPLIES) IMPLANT
PAD CAST 4YDX4 CTTN HI CHSV (CAST SUPPLIES) ×1 IMPLANT
PADDING CAST ABS 4INX4YD NS (CAST SUPPLIES)
PADDING CAST ABS COTTON 4X4 ST (CAST SUPPLIES) IMPLANT
PADDING CAST COTTON 3X4 STRL (CAST SUPPLIES)
PADDING CAST COTTON 4X4 STRL (CAST SUPPLIES) ×2
PADDING UNDERCAST 2  STERILE (CAST SUPPLIES) ×2 IMPLANT
PLATE LATERAL LEFT XLG (Plate) ×2 IMPLANT
PLATE LG LF MEDIAL DIS HUMERUS (Plate) ×2 IMPLANT
PUTTY DBM STAGRAFT 5CC (Putty) ×2 IMPLANT
SCREW CORT 3.5X26 (Screw) ×2 IMPLANT
SCREW CORT T15 24X3.5XST LCK (Screw) ×1 IMPLANT
SCREW CORT T15 26X3.5XST LCK (Screw) ×1 IMPLANT
SCREW CORT T15 28X3.5XST LCK (Screw) ×1 IMPLANT
SCREW CORTICAL 3.5X24MM (Screw) ×2 IMPLANT
SCREW CORTICAL 3.5X28MM (Screw) ×2 IMPLANT
SCREW LOCK 3.5X50 DIST TIB (Screw) ×2 IMPLANT
SCREW LOCK 3.5X56 DIST TIB (Screw) ×2 IMPLANT
SCREW LOCK CORT STAR 3.5X10 (Screw) ×4 IMPLANT
SCREW LOCK CORT STAR 3.5X16 (Screw) ×2 IMPLANT
SCREW LOCK CORT STAR 3.5X18 (Screw) ×2 IMPLANT
SCREW LOCK CORT STAR 3.5X22 (Screw) ×2 IMPLANT
SCREW LOW PROFILE 22MMX3.5MM (Screw) ×2 IMPLANT
SCREW LP 3.5X38MM (Screw) ×2 IMPLANT
SCREW NON LOCKING LP 3.5 14MM (Screw) ×4 IMPLANT
SLING ARM FOAM STRAP XLG (SOFTGOODS) ×2 IMPLANT
SPLINT PLASTER CAST XFAST 5X30 (CAST SUPPLIES) ×1 IMPLANT
SPLINT PLASTER XFAST SET 5X30 (CAST SUPPLIES) ×1
SPONGE GAUZE 4X4 12PLY (GAUZE/BANDAGES/DRESSINGS) ×2 IMPLANT
SPONGE LAP 18X18 X RAY DECT (DISPOSABLE) ×4 IMPLANT
SPONGE LAP 4X18 X RAY DECT (DISPOSABLE) ×2 IMPLANT
STOCKINETTE IMPERVIOUS 9X36 MD (GAUZE/BANDAGES/DRESSINGS) ×2 IMPLANT
STRIP CLOSURE SKIN 1/2X4 (GAUZE/BANDAGES/DRESSINGS) ×2 IMPLANT
SUT ETHIBOND 0 MO6 C/R (SUTURE) IMPLANT
SUT ETHILON 3 0 PS 1 (SUTURE) IMPLANT
SUT ETHILON 4 0 PS 2 18 (SUTURE) IMPLANT
SUT MNCRL AB 4-0 PS2 18 (SUTURE) ×2 IMPLANT
SUT STEEL 5 (SUTURE) ×2 IMPLANT
SUT STEEL 7 (SUTURE) ×2 IMPLANT
SUT VIC AB 0 CT1 27 (SUTURE) ×10
SUT VIC AB 0 CT1 27XBRD ANBCTR (SUTURE) ×5 IMPLANT
SUT VIC AB 3-0 SH 18 (SUTURE) ×4 IMPLANT
SUT VIC AB 3-0 SH 27 (SUTURE) ×2
SUT VIC AB 3-0 SH 27X BRD (SUTURE) ×1 IMPLANT
SYR CONTROL 10ML LL (SYRINGE) IMPLANT
TOWEL OR 17X24 6PK STRL BLUE (TOWEL DISPOSABLE) ×2 IMPLANT
TOWEL OR 17X26 10 PK STRL BLUE (TOWEL DISPOSABLE) ×2 IMPLANT
TUBE CONNECTING 12X1/4 (SUCTIONS) ×2 IMPLANT
TUBE SUCT ARGYLE STRL (TUBING) ×2 IMPLANT
UNDERPAD 30X30 INCONTINENT (UNDERPADS AND DIAPERS) ×2 IMPLANT
WASHER 3.5MM (Orthopedic Implant) ×2 IMPLANT
WATER STERILE IRR 1000ML POUR (IV SOLUTION) ×2 IMPLANT

## 2012-01-08 NOTE — Progress Notes (Signed)
Pt currently in surgery, unable to preform morning assessment at this time. Will complete upon arrival to unit.
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
## 2012-01-08 NOTE — Anesthesia Preprocedure Evaluation (Addendum)
Anesthesia Evaluation  Patient identified by MRN, date of birth, ID band Patient awake    Reviewed: Allergy & Precautions, H&P , NPO status   Airway Mallampati: II TM Distance: >3 FB Neck ROM: Full    Dental  (+) Dental Advisory Given   Pulmonary Current Smoker,          Cardiovascular hypertension,     Neuro/Psych    GI/Hepatic negative GI ROS, Neg liver ROS,   Endo/Other  negative endocrine ROS  Renal/GU negative Renal ROS     Musculoskeletal   Abdominal   Peds  Hematology   Anesthesia Other Findings   Reproductive/Obstetrics                          Anesthesia Physical Anesthesia Plan  ASA: I  Anesthesia Plan: General   Post-op Pain Management:    Induction:   Airway Management Planned: Oral ETT  Additional Equipment:   Intra-op Plan:   Post-operative Plan: Extubation in OR  Informed Consent: I have reviewed the patients History and Physical, chart, labs and discussed the procedure including the risks, benefits and alternatives for the proposed anesthesia with the patient or authorized representative who has indicated his/her understanding and acceptance.   Dental advisory given  Plan Discussed with: CRNA, Anesthesiologist and Surgeon  Anesthesia Plan Comments:        Anesthesia Quick Evaluation

## 2012-01-08 NOTE — Anesthesia Procedure Notes (Signed)
Procedure Name: Intubation Date/Time: 01/08/2012 8:03 AM Performed by: Rogelia Boga Pre-anesthesia Checklist: Patient identified, Emergency Drugs available, Suction available, Patient being monitored and Timeout performed Patient Re-evaluated:Patient Re-evaluated prior to inductionOxygen Delivery Method: Circle system utilized Preoxygenation: Pre-oxygenation with 100% oxygen Intubation Type: IV induction, Rapid sequence and Cricoid Pressure applied Laryngoscope Size: Mac and 4 Grade View: Grade II Tube type: Oral Tube size: 7.5 mm Number of attempts: 1 Airway Equipment and Method: Stylet Placement Confirmation: ETT inserted through vocal cords under direct vision,  positive ETCO2 and breath sounds checked- equal and bilateral Secured at: 22 cm Tube secured with: Tape Dental Injury: Teeth and Oropharynx as per pre-operative assessment
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
## 2012-01-08 NOTE — Transfer of Care (Signed)
Immediate Anesthesia Transfer of Care Note  Patient: Jacob Alvarado  Procedure(s) Performed: Procedure(s) (LRB) with comments: OPEN REDUCTION INTERNAL FIXATION (ORIF) ELBOW/OLECRANON FRACTURE (Left) - ORIF distal humerus, olecranon fracture ULNAR NERVE DECOMPRESSION/TRANSPOSITION (Left)  Patient Location: PACU  Anesthesia Type:General  Level of Consciousness: awake, alert , oriented and patient cooperative  Airway & Oxygen Therapy: Patient Spontanous Breathing and Patient connected to nasal cannula oxygen  Post-op Assessment: Report given to PACU RN, Post -op Vital signs reviewed and stable and Patient moving all extremities  Post vital signs: Reviewed and stable  Complications: No apparent anesthesia complications
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
## 2012-01-08 NOTE — Progress Notes (Signed)
Pt began having elevated HR as high as 130. All other vital signs remained stable. PRN pain medication administered.  Dr. Kandis Mannan was notified, new order received to administer 1L bolus and obtain stat CBC. Will administer bolus and continue to monitor.

## 2012-01-08 NOTE — Anesthesia Postprocedure Evaluation (Signed)
  Anesthesia Post-op Note  Patient: Jacob Alvarado  Procedure(s) Performed: Procedure(s) (LRB) with comments: OPEN REDUCTION INTERNAL FIXATION (ORIF) ELBOW/OLECRANON FRACTURE (Left) - ORIF distal humerus, olecranon fracture ULNAR NERVE DECOMPRESSION/TRANSPOSITION (Left)  Patient Location: PACU  Anesthesia Type:General  Level of Consciousness: awake  Airway and Oxygen Therapy: Patient Spontanous Breathing  Post-op Pain: mild  Post-op Assessment: Post-op Vital signs reviewed and Patient's Cardiovascular Status Stable  Post-op Vital Signs: Reviewed and stable  Complications: No apparent anesthesia complications

## 2012-01-08 NOTE — Preoperative (Signed)
Beta Blockers   Reason not to administer Beta Blockers:Not Applicable 

## 2012-01-09 ENCOUNTER — Encounter (HOSPITAL_COMMUNITY): Payer: Self-pay | Admitting: Orthopedic Surgery

## 2012-01-09 DIAGNOSIS — D62 Acute posthemorrhagic anemia: Secondary | ICD-10-CM | POA: Diagnosis not present

## 2012-01-09 LAB — BASIC METABOLIC PANEL
CO2: 27 mEq/L (ref 19–32)
Chloride: 99 mEq/L (ref 96–112)
Creatinine, Ser: 0.9 mg/dL (ref 0.50–1.35)
Sodium: 133 mEq/L — ABNORMAL LOW (ref 135–145)

## 2012-01-09 LAB — CBC
MCV: 79.1 fL (ref 78.0–100.0)
Platelets: 132 10*3/uL — ABNORMAL LOW (ref 150–400)
RBC: 2.97 MIL/uL — ABNORMAL LOW (ref 4.22–5.81)
WBC: 11.2 10*3/uL — ABNORMAL HIGH (ref 4.0–10.5)

## 2012-01-09 MED ORDER — BACITRACIN ZINC 500 UNIT/GM EX OINT
TOPICAL_OINTMENT | Freq: Two times a day (BID) | CUTANEOUS | Status: DC
Start: 2012-01-09 — End: 2012-01-16
  Administered 2012-01-09 – 2012-01-15 (×10): via TOPICAL
  Filled 2012-01-09 (×3): qty 15

## 2012-01-09 MED ORDER — HYDROCODONE-ACETAMINOPHEN 10-325 MG PO TABS
0.5000 | ORAL_TABLET | ORAL | Status: DC | PRN
  Administered 2012-01-09 – 2012-01-12 (×4): 2 via ORAL
  Filled 2012-01-09 (×4): qty 2

## 2012-01-09 MED ORDER — HYDROMORPHONE HCL PF 1 MG/ML IJ SOLN: 0.5000 mg | INTRAMUSCULAR | Status: DC | PRN

## 2012-01-09 MED ORDER — DEXTROSE 5 % IV SOLN
575.0000 mg | INTRAVENOUS | Status: DC
  Administered 2012-01-09: 575 mg via INTRAVENOUS
  Filled 2012-01-09 (×3): qty 14.38

## 2012-01-09 MED ORDER — ENOXAPARIN SODIUM 30 MG/0.3ML ~~LOC~~ SOLN
30.0000 mg | Freq: Two times a day (BID) | SUBCUTANEOUS | Status: DC
  Administered 2012-01-09 – 2012-01-12 (×4): 30 mg via SUBCUTANEOUS
  Filled 2012-01-09 (×8): qty 0.3
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
## 2012-01-09 NOTE — Progress Notes (Signed)
Appropriate response from one unit of blood.  Further surgery necessary.  This patient has been seen and I agree with the findings and treatment plan.  Marta Lamas. Gae Bon, MD, FACS [phone] (pager) [phone] (direct pager) Trauma Surgeon

## 2012-01-09 NOTE — Progress Notes (Signed)
Rehab Admissions Coordinator Note:  Patient was screened by Trish Mage for appropriateness for an Inpatient Acute Rehab Consult.  Noted PT recommending inpatient rehab consult.  At this time, we are recommending Inpatient Rehab consult.  Trish Mage 01/09/2012, 12:18 PM  I can be reached at [phone].

## 2012-01-09 NOTE — Progress Notes (Signed)
Palpable right femoral pulse, no evidence of pseudoaneurysm.  Will sign off.  Fabienne Bruns, MD [facility] Office: [phone] Pager: [phone]
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
## 2012-01-09 NOTE — Progress Notes (Signed)
UR completed 

## 2012-01-09 NOTE — Progress Notes (Signed)
Pt transferred to 5N room 21, per MD order. Report called to receiving nurse and all questions answered. Foley cath was d/c @0930  prior to transfer.
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
## 2012-01-10 ENCOUNTER — Inpatient Hospital Stay (HOSPITAL_COMMUNITY): Payer: Medicaid Other | Admitting: *Deleted

## 2012-01-10 ENCOUNTER — Encounter (HOSPITAL_COMMUNITY): Admission: EM | Disposition: A | Discharge status: Home / Self Care | Payer: Self-pay | Source: Home / Self Care

## 2012-01-10 ENCOUNTER — Encounter (HOSPITAL_COMMUNITY): Payer: Self-pay | Admitting: *Deleted

## 2012-01-10 ENCOUNTER — Inpatient Hospital Stay (HOSPITAL_COMMUNITY): Payer: Medicaid Other

## 2012-01-10 DIAGNOSIS — S52023A Displaced fracture of olecranon process without intraarticular extension of unspecified ulna, initial encounter for closed fracture: Secondary | ICD-10-CM

## 2012-01-10 DIAGNOSIS — S82409A Unspecified fracture of shaft of unspecified fibula, initial encounter for closed fracture: Secondary | ICD-10-CM

## 2012-01-10 DIAGNOSIS — W320XXA Accidental handgun discharge, initial encounter: Secondary | ICD-10-CM

## 2012-01-10 DIAGNOSIS — S82209A Unspecified fracture of shaft of unspecified tibia, initial encounter for closed fracture: Secondary | ICD-10-CM

## 2012-01-10 HISTORY — PX: TIBIA IM NAIL INSERTION: SHX2516

## 2012-01-10 LAB — CBC
Hemoglobin: 8.2 g/dL — ABNORMAL LOW (ref 13.0–17.0)
RBC: 2.9 MIL/uL — ABNORMAL LOW (ref 4.22–5.81)
WBC: 9.8 10*3/uL (ref 4.0–10.5)

## 2012-01-10 SURGERY — INSERTION, INTRAMEDULLARY ROD, TIBIA
Anesthesia: General | Site: Leg Lower | Laterality: Left | Wound class: Clean

## 2012-01-10 MED ORDER — CEFAZOLIN SODIUM-DEXTROSE 2-3 GM-% IV SOLR
INTRAVENOUS | Status: DC | PRN
  Administered 2012-01-10: 2 g via INTRAVENOUS

## 2012-01-10 MED ORDER — DIPHENHYDRAMINE HCL 50 MG/ML IJ SOLN: 12.5000 mg | Freq: Four times a day (QID) | INTRAMUSCULAR | Status: DC | PRN

## 2012-01-10 MED ORDER — LACTATED RINGERS IV SOLN
INTRAVENOUS | Status: DC | PRN
  Administered 2012-01-10: 13:00:00 via INTRAVENOUS

## 2012-01-10 MED ORDER — NEOSTIGMINE METHYLSULFATE 1 MG/ML IJ SOLN
INTRAMUSCULAR | Status: DC | PRN
  Administered 2012-01-10: 4 mg via INTRAVENOUS

## 2012-01-10 MED ORDER — HYDROMORPHONE HCL PF 1 MG/ML IJ SOLN
INTRAMUSCULAR | Status: AC
  Filled 2012-01-10: qty 1

## 2012-01-10 MED ORDER — ESMOLOL HCL 10 MG/ML IV SOLN
INTRAVENOUS | Status: DC | PRN
  Administered 2012-01-10: 10 mg via INTRAVENOUS

## 2012-01-10 MED ORDER — VANCOMYCIN HCL 500 MG IV SOLR
INTRAVENOUS | Status: AC
  Filled 2012-01-10: qty 500

## 2012-01-10 MED ORDER — HYDROMORPHONE HCL PF 1 MG/ML IJ SOLN
0.2500 mg | INTRAMUSCULAR | Status: DC | PRN
  Administered 2012-01-10 (×4): 0.5 mg via INTRAVENOUS

## 2012-01-10 MED ORDER — SODIUM CHLORIDE 0.9 % IV SOLN
INTRAVENOUS | Status: DC | PRN
  Administered 2012-01-10: 14:00:00 via INTRAVENOUS

## 2012-01-10 MED ORDER — OXYCODONE HCL 5 MG PO TABS: 5.0000 mg | ORAL_TABLET | Freq: Once | ORAL | Status: DC | PRN

## 2012-01-10 MED ORDER — ONDANSETRON HCL 4 MG/2ML IJ SOLN
INTRAMUSCULAR | Status: DC | PRN
  Administered 2012-01-10 (×2): 4 mg via INTRAVENOUS

## 2012-01-10 MED ORDER — TOBRAMYCIN SULFATE 1.2 G IJ SOLR
INTRAMUSCULAR | Status: DC | PRN
  Administered 2012-01-10: 1.2 g

## 2012-01-10 MED ORDER — NALOXONE HCL 0.4 MG/ML IJ SOLN: 0.4000 mg | INTRAMUSCULAR | Status: DC | PRN

## 2012-01-10 MED ORDER — OXYCODONE HCL 5 MG/5ML PO SOLN: 5.0000 mg | Freq: Once | ORAL | Status: DC | PRN

## 2012-01-10 MED ORDER — HYDROMORPHONE 0.3 MG/ML IV SOLN
INTRAVENOUS | Status: DC
Start: 2012-01-10 — End: 2012-01-11
  Administered 2012-01-10: 0.3 mg via INTRAVENOUS
  Administered 2012-01-10: 0.6 mg via INTRAVENOUS
  Administered 2012-01-11: 1.5 mg via INTRAVENOUS
  Administered 2012-01-11: 2.4 mg via INTRAVENOUS

## 2012-01-10 MED ORDER — HYDROMORPHONE 0.3 MG/ML IV SOLN
INTRAVENOUS | Status: AC
  Filled 2012-01-10: qty 25

## 2012-01-10 MED ORDER — PROPOFOL 10 MG/ML IV BOLUS
INTRAVENOUS | Status: DC | PRN
  Administered 2012-01-10: 200 mg via INTRAVENOUS

## 2012-01-10 MED ORDER — TOBRAMYCIN SULFATE 1.2 G IJ SOLR
INTRAMUSCULAR | Status: AC
  Filled 2012-01-10: qty 1.2

## 2012-01-10 MED ORDER — ROCURONIUM BROMIDE 100 MG/10ML IV SOLN
INTRAVENOUS | Status: DC | PRN
  Administered 2012-01-10: 20 mg via INTRAVENOUS
  Administered 2012-01-10: 10 mg via INTRAVENOUS
  Administered 2012-01-10: 20 mg via INTRAVENOUS

## 2012-01-10 MED ORDER — LIDOCAINE HCL (CARDIAC) 20 MG/ML IV SOLN
INTRAVENOUS | Status: DC | PRN
  Administered 2012-01-10: 100 mg via INTRAVENOUS

## 2012-01-10 MED ORDER — FENTANYL CITRATE 0.05 MG/ML IJ SOLN
INTRAMUSCULAR | Status: DC | PRN
  Administered 2012-01-10 (×2): 25 ug via INTRAVENOUS
  Administered 2012-01-10: 100 ug via INTRAVENOUS
  Administered 2012-01-10 (×7): 50 ug via INTRAVENOUS

## 2012-01-10 MED ORDER — DIPHENHYDRAMINE HCL 12.5 MG/5ML PO ELIX: 12.5000 mg | ORAL_SOLUTION | Freq: Four times a day (QID) | ORAL | Status: DC | PRN

## 2012-01-10 MED ORDER — SODIUM CHLORIDE 0.9 % IV SOLN
INTRAVENOUS | Status: DC
  Administered 2012-01-11: 05:00:00 via INTRAVENOUS

## 2012-01-10 MED ORDER — VANCOMYCIN HCL 500 MG IV SOLR
INTRAVENOUS | Status: DC | PRN
  Administered 2012-01-10: 500 mg

## 2012-01-10 MED ORDER — CEFAZOLIN SODIUM-DEXTROSE 2-3 GM-% IV SOLR
INTRAVENOUS | Status: AC
  Filled 2012-01-10: qty 50

## 2012-01-10 MED ORDER — DEXAMETHASONE SODIUM PHOSPHATE 4 MG/ML IJ SOLN
INTRAMUSCULAR | Status: DC | PRN
  Administered 2012-01-10: 8 mg via INTRAVENOUS

## 2012-01-10 MED ORDER — SUCCINYLCHOLINE CHLORIDE 20 MG/ML IJ SOLN
INTRAMUSCULAR | Status: DC | PRN
  Administered 2012-01-10: 140 mg via INTRAVENOUS

## 2012-01-10 MED ORDER — METOCLOPRAMIDE HCL 5 MG/ML IJ SOLN: 10.0000 mg | Freq: Once | INTRAMUSCULAR | Status: DC | PRN

## 2012-01-10 MED ORDER — ONDANSETRON HCL 4 MG/2ML IJ SOLN: 4.0000 mg | Freq: Four times a day (QID) | INTRAMUSCULAR | Status: DC | PRN

## 2012-01-10 MED ORDER — GLYCOPYRROLATE 0.2 MG/ML IJ SOLN
INTRAMUSCULAR | Status: DC | PRN
  Administered 2012-01-10: 0.6 mg via INTRAVENOUS

## 2012-01-10 MED ORDER — VECURONIUM BROMIDE 10 MG IV SOLR
INTRAVENOUS | Status: DC | PRN
  Administered 2012-01-10 (×3): 1 mg via INTRAVENOUS

## 2012-01-10 MED ORDER — SODIUM CHLORIDE 0.9 % IJ SOLN: 9.0000 mL | INTRAMUSCULAR | Status: DC | PRN

## 2012-01-10 MED ORDER — MIDAZOLAM HCL 5 MG/5ML IJ SOLN
INTRAMUSCULAR | Status: DC | PRN
  Administered 2012-01-10 (×2): 1 mg via INTRAVENOUS

## 2012-01-10 MED ORDER — SODIUM CHLORIDE 0.9 % IR SOLN
Status: DC | PRN
  Administered 2012-01-10: 3000 mL

## 2012-01-10 SURGICAL SUPPLY — 70 items
500ML CANISTER FOR INFO VAC ×2 IMPLANT
BANDAGE ELASTIC 4 VELCRO ST LF (GAUZE/BANDAGES/DRESSINGS) ×2 IMPLANT
BANDAGE ELASTIC 6 VELCRO ST LF (GAUZE/BANDAGES/DRESSINGS) ×2 IMPLANT
BANDAGE GAUZE ELAST BULKY 4 IN (GAUZE/BANDAGES/DRESSINGS) ×2 IMPLANT
BIT DRILL 2.5X2.75 QC CALB (BIT) ×2 IMPLANT
BIT DRILL 3.5X5.5 QC CALB (BIT) ×2 IMPLANT
BIT DRILL 3.8X6 NS (BIT) ×2 IMPLANT
BIT DRILL 4.4 NS (BIT) ×2 IMPLANT
BLADE SURG 10 STRL SS (BLADE) ×2 IMPLANT
BONE CEMENT PALACOSE (Orthopedic Implant) IMPLANT
BOWL SMART MIX CTS (DISPOSABLE) ×2 IMPLANT
BRUSH SCRUB DISP (MISCELLANEOUS) ×4 IMPLANT
CEMENT BONE PALACOSE (Orthopedic Implant) IMPLANT
CLOTH BEACON ORANGE TIMEOUT ST (SAFETY) ×2 IMPLANT
COVER SURGICAL LIGHT HANDLE (MISCELLANEOUS) ×4 IMPLANT
DRAPE C-ARM 42X72 X-RAY (DRAPES) ×2 IMPLANT
DRAPE C-ARMOR (DRAPES) ×2 IMPLANT
DRAPE EXTREMITY T 121X128X90 (DRAPE) ×2 IMPLANT
DRAPE INCISE IOBAN 66X45 STRL (DRAPES) IMPLANT
DRAPE U-SHAPE 47X51 STRL (DRAPES) ×2 IMPLANT
DRSG ADAPTIC 3X8 NADH LF (GAUZE/BANDAGES/DRESSINGS) ×2 IMPLANT
DRSG MEPITEL 4X7.2 (GAUZE/BANDAGES/DRESSINGS) ×4 IMPLANT
DRSG VAC ATS LRG SENSATRAC (GAUZE/BANDAGES/DRESSINGS) ×2 IMPLANT
DRSG VAC ATS MED SENSATRAC (GAUZE/BANDAGES/DRESSINGS) ×2 IMPLANT
ELECT REM PT RETURN 9FT ADLT (ELECTROSURGICAL) ×2
ELECTRODE REM PT RTRN 9FT ADLT (ELECTROSURGICAL) ×1 IMPLANT
EVACUATOR 1/8 PVC DRAIN (DRAIN) IMPLANT
GLOVE BIO SURGEON STRL SZ7.5 (GLOVE) ×2 IMPLANT
GLOVE BIO SURGEON STRL SZ8 (GLOVE) ×2 IMPLANT
GLOVE BIO SURGEON STRL SZ8.5 (GLOVE) IMPLANT
GLOVE BIOGEL PI IND STRL 7.5 (GLOVE) ×2 IMPLANT
GLOVE BIOGEL PI IND STRL 8 (GLOVE) ×1 IMPLANT
GLOVE BIOGEL PI INDICATOR 7.5 (GLOVE) ×2
GLOVE BIOGEL PI INDICATOR 8 (GLOVE) ×1
GOWN PREVENTION PLUS XLARGE (GOWN DISPOSABLE) ×2 IMPLANT
GOWN STRL NON-REIN LRG LVL3 (GOWN DISPOSABLE) ×4 IMPLANT
GUIDEWIRE BALL NOSE 80CM (WIRE) ×2 IMPLANT
KIT BASIN OR (CUSTOM PROCEDURE TRAY) ×2 IMPLANT
KIT INFUSE LRG II (Orthopedic Implant) ×2 IMPLANT
KIT ROOM TURNOVER OR (KITS) ×2 IMPLANT
NAIL TIBIAL 8X37.5 (Nail) ×2 IMPLANT
PACK GENERAL/GYN (CUSTOM PROCEDURE TRAY) ×2 IMPLANT
PAD ARMBOARD 7.5X6 YLW CONV (MISCELLANEOUS) ×4 IMPLANT
SCREW ACECAP 36MM (Screw) ×2 IMPLANT
SCREW ACECAP 48MM (Screw) ×2 IMPLANT
SCREW CORTICAL 3.5MM 36MM (Screw) ×2 IMPLANT
SCREW CORTICAL 3.5MM 40MM (Screw) ×2 IMPLANT
SCREW PROXIMAL 4.5MMX18MM (Screw) ×2 IMPLANT
SCREW PROXIMAL DEPUY (Screw) ×1 IMPLANT
SCREW PRXML FT 65X5.5XNS CORT (Screw) ×1 IMPLANT
SET CYSTO W/LG BORE CLAMP LF (SET/KITS/TRAYS/PACK) ×2 IMPLANT
SPONGE GAUZE 4X4 12PLY (GAUZE/BANDAGES/DRESSINGS) ×2 IMPLANT
STAPLER VISISTAT 35W (STAPLE) ×2 IMPLANT
STRIP CLOSURE SKIN 1/2X4 (GAUZE/BANDAGES/DRESSINGS) IMPLANT
SUT ETHILON 1 TP 1 60 (SUTURE) ×2 IMPLANT
SUT ETHILON 2 LR (SUTURE) ×2 IMPLANT
SUT ETHILON 3 0 PS 1 (SUTURE) ×2 IMPLANT
SUT ETHILON O TP 1 (SUTURE) ×2 IMPLANT
SUT PDS AB 2-0 CT1 27 (SUTURE) ×2 IMPLANT
SUT PROLENE 3 0 PS 2 (SUTURE) IMPLANT
SUT VIC AB 0 CT1 27 (SUTURE)
SUT VIC AB 0 CT1 27XBRD ANBCTR (SUTURE) IMPLANT
SUT VIC AB 2-0 CT1 27 (SUTURE) ×6
SUT VIC AB 2-0 CT1 TAPERPNT 27 (SUTURE) ×3 IMPLANT
SUT VIC AB 2-0 CT3 27 (SUTURE) IMPLANT
TOWEL OR 17X24 6PK STRL BLUE (TOWEL DISPOSABLE) ×2 IMPLANT
TOWEL OR 17X26 10 PK STRL BLUE (TOWEL DISPOSABLE) ×4 IMPLANT
TRAY FOLEY CATH 14FR (SET/KITS/TRAYS/PACK) ×2 IMPLANT
Y CONNECTOR ×2 IMPLANT
YANKAUER SUCT BULB TIP NO VENT (SUCTIONS) IMPLANT
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
## 2012-01-10 NOTE — Progress Notes (Signed)
Orthopedic Tech Progress Note Patient Details:  Jacob Alvarado 21-Jan-1981 [id number]  Patient ID: [name], male   DOB: 09-08-1980, 31 y.o.   MRN: [id number] Dr to enter order  Nikki Dom 01/10/2012, 8:08 PM

## 2012-01-10 NOTE — Progress Notes (Signed)
Orthopedic Tech Progress Note Patient Details:  Jacob Alvarado 1980-10-20 [id number]  Ortho Devices Type of Ortho Device: Other (comment) (prafo boot) Ortho Device/Splint Interventions: Freeman Caldron, Pattie Flaharty 01/10/2012, 8:06 PM

## 2012-01-10 NOTE — Transfer of Care (Signed)
Immediate Anesthesia Transfer of Care Note  Patient: Jacob Alvarado  Procedure(s) Performed: Procedure(s) (LRB) with comments: INTRAMEDULLARY (IM) NAIL TIBIAL (Left) - Ex-Fix removal left tibia, IM Nail Left Tibia, partial fasciotomy closure and wound vac placement  Patient Location: PACU  Anesthesia Type:General  Level of Consciousness: awake, alert  and oriented  Airway & Oxygen Therapy: Patient Spontanous Breathing and Patient connected to nasal cannula oxygen  Post-op Assessment: Report given to PACU RN, Post -op Vital signs reviewed and stable and Patient moving all extremities X 4  Post vital signs: Reviewed and stable  Complications: No apparent anesthesia complications

## 2012-01-10 NOTE — Progress Notes (Signed)
Wound vac canister full.  Canister changed.  Jacob Alvarado

## 2012-01-10 NOTE — Preoperative (Signed)
Beta Blockers   Reason not to administer Beta Blockers:Not Applicable 

## 2012-01-10 NOTE — Anesthesia Preprocedure Evaluation (Signed)
Anesthesia Evaluation  Patient identified by MRN, date of birth, ID band Patient awake    Reviewed: Allergy & Precautions, H&P , NPO status , Patient's Chart, lab work & pertinent test results, reviewed documented beta blocker date and time   Airway Mallampati: II TM Distance: >3 FB Neck ROM: full    Dental   Pulmonary neg pulmonary ROS,  breath sounds clear to auscultation        Cardiovascular hypertension, Rhythm:regular     Neuro/Psych negative neurological ROS  negative psych ROS   GI/Hepatic negative GI ROS, Neg liver ROS,   Endo/Other  negative endocrine ROS  Renal/GU negative Renal ROS  negative genitourinary   Musculoskeletal   Abdominal   Peds  Hematology  (+) Blood dyscrasia, anemia ,   Anesthesia Other Findings See surgeon's H&P   Reproductive/Obstetrics negative OB ROS                           Anesthesia Physical Anesthesia Plan  ASA: III  Anesthesia Plan: General   Post-op Pain Management:    Induction: Intravenous  Airway Management Planned: Oral ETT  Additional Equipment:   Intra-op Plan:   Post-operative Plan: Extubation in OR  Informed Consent: I have reviewed the patients History and Physical, chart, labs and discussed the procedure including the risks, benefits and alternatives for the proposed anesthesia with the patient or authorized representative who has indicated his/her understanding and acceptance.   Dental Advisory Given  Plan Discussed with: CRNA and Surgeon  Anesthesia Plan Comments:         Anesthesia Quick Evaluation

## 2012-01-10 NOTE — Anesthesia Postprocedure Evaluation (Signed)
  Anesthesia Post-op Note  Patient: Jacob Alvarado  Procedure(s) Performed: Procedure(s) (LRB) with comments: INTRAMEDULLARY (IM) NAIL TIBIAL (Left) - Ex-Fix removal left tibia, IM Nail Left Tibia, partial fasciotomy closure and wound vac placement  Patient Location: PACU  Anesthesia Type:General  Level of Consciousness: awake, alert  and oriented  Airway and Oxygen Therapy: Patient Spontanous Breathing  Post-op Pain: mild  Post-op Assessment: Post-op Vital signs reviewed, Patient's Cardiovascular Status Stable and Respiratory Function Stable  Post-op Vital Signs: stable  Complications: No apparent anesthesia complications

## 2012-01-10 NOTE — Progress Notes (Signed)
PT Cancellation Note  Patient Details Name: Jacob Alvarado MRN: [id number] DOB: [birth year]   Cancelled Treatment:  Noted pt is scheduled to go to surgery today with Dr. Carola Frost. Will have a PT follow up tomorrow for re-assessment post surgery.  Sallyanne Kuster 01/10/2012, 8:44 AM  Sallyanne Kuster, PTA Office- [phone]

## 2012-01-10 NOTE — Brief Op Note (Signed)
01/07/2012 - 01/10/2012  5:31 PM  PATIENT:  Jacob Alvarado  31 y.o. male  PRE-OPERATIVE DIAGNOSIS:  Left Open Tibia Fracture, left tibial plafond fracture, retained external fixator, open fasciotomies  POST-OPERATIVE DIAGNOSIS:  Left Open Tibia Fracture, left tibial plafond fracture, retained external fixator, open fasciotomies  PROCEDURE:  Procedure(s) (LRB) with comments: INTRAMEDULLARY (IM) NAIL TIBIAL (Left) - Ex-Fix removal left tibia, IM Nail Left Tibia, partial fasciotomy closure and wound vac placement, I&D of open fracture    SURGEON:  Surgeon(s) and Role:    * Budd Palmer, MD - Primary   PHYSICIAN ASSISTANT: Montez Morita, [facility]  ANESTHESIA:   general  EBL:  Total I/O In: 2150 [I.V.:1800; Blood:350] Out: 1150 [Urine:950; Blood:200]  BLOOD ADMINISTERED:1 uPRBC  DRAINS: large wound vacs, two   LOCAL MEDICATIONS USED:  NONE  SPECIMEN:  No Specimen  DISPOSITION OF SPECIMEN:  N/A  COUNTS:  YES  TOURNIQUET:  * No tourniquets in log *  DICTATION: .Other Dictation: Dictation Number [id number]  PLAN OF CARE: Admit to inpatient   PATIENT DISPOSITION:  PACU - hemodynamically stable.   Delay start of Pharmacological VTE agent (>24hrs) due to surgical blood loss or risk of bleeding: no

## 2012-01-10 NOTE — Progress Notes (Signed)
OR today with Dr. Carola Frost for IM nail L tibia Plan CIR once surgeries complete Patient examined and I agree with the assessment and plan  Violeta Gelinas, MD, MPH, FACS Pager: [phone]  01/10/2012 9:52 AM

## 2012-01-10 NOTE — Progress Notes (Signed)
I will follow up postoperatively with pt and functional status to assist with rehab venue options. [phone]
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
## 2012-01-11 ENCOUNTER — Inpatient Hospital Stay (HOSPITAL_COMMUNITY): Payer: Medicaid Other

## 2012-01-11 LAB — TYPE AND SCREEN
ABO/RH(D): O POS
Unit division: 0
Unit division: 0

## 2012-01-11 LAB — CBC
HCT: 19 % — ABNORMAL LOW (ref 39.0–52.0)
MCV: 78.5 fL (ref 78.0–100.0)
RBC: 2.42 MIL/uL — ABNORMAL LOW (ref 4.22–5.81)
WBC: 11.6 10*3/uL — ABNORMAL HIGH (ref 4.0–10.5)

## 2012-01-11 MED ORDER — OXYCODONE HCL 5 MG PO TABS
5.0000 mg | ORAL_TABLET | ORAL | Status: DC | PRN
  Administered 2012-01-11 – 2012-01-12 (×5): 10 mg via ORAL
  Filled 2012-01-11 (×5): qty 2

## 2012-01-11 MED ORDER — METHOCARBAMOL 100 MG/ML IJ SOLN
500.0000 mg | Freq: Four times a day (QID) | INTRAVENOUS | Status: DC | PRN
  Filled 2012-01-11: qty 10

## 2012-01-11 MED ORDER — CEFAZOLIN SODIUM 1-5 GM-% IV SOLN
1.0000 g | Freq: Three times a day (TID) | INTRAVENOUS | Status: DC
  Administered 2012-01-11 – 2012-01-15 (×13): 1 g via INTRAVENOUS
  Filled 2012-01-11 (×15): qty 50

## 2012-01-11 MED ORDER — METHOCARBAMOL 500 MG PO TABS
500.0000 mg | ORAL_TABLET | Freq: Four times a day (QID) | ORAL | Status: DC | PRN
  Administered 2012-01-13 – 2012-01-15 (×3): 1000 mg via ORAL
  Filled 2012-01-11 (×3): qty 2
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
## 2012-01-11 NOTE — Progress Notes (Signed)
PT Cancellation Note  Patient Details Name: Jacob Alvarado MRN: [id number] DOB: 03-23-1980   Cancelled Treatment:    Reason Eval/Treat Not Completed: Medical issues which prohibited therapy;Pain limiting ability to participate (Pt Hgb low 7.0 pr c/o nausea whenever oob.  )   Louretta Tantillo 01/11/2012, 3:31 PM Theron Arista L. Areeb Corron DPT [phone]

## 2012-01-11 NOTE — Progress Notes (Signed)
I met with patient briefly at bedside as he got up to chair with nursing. I await surgical procedure completion and then will see how he is functionally with therapy to determine most appropriate rehab venue. I will follow up tomorrow to discuss further with pt. [phone]
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
## 2012-01-12 ENCOUNTER — Other Ambulatory Visit: Payer: Self-pay

## 2012-01-12 ENCOUNTER — Encounter (HOSPITAL_COMMUNITY): Payer: Self-pay | Admitting: Orthopedic Surgery

## 2012-01-12 LAB — BASIC METABOLIC PANEL
CO2: 27 mEq/L (ref 19–32)
Chloride: 99 mEq/L (ref 96–112)
Creatinine, Ser: 0.75 mg/dL (ref 0.50–1.35)
Glucose, Bld: 96 mg/dL (ref 70–99)

## 2012-01-12 LAB — CBC
Hemoglobin: 7.4 g/dL — ABNORMAL LOW (ref 13.0–17.0)
MCH: 28.7 pg (ref 26.0–34.0)
MCV: 80.2 fL (ref 78.0–100.0)
Platelets: 240 10*3/uL (ref 150–400)
RBC: 2.58 MIL/uL — ABNORMAL LOW (ref 4.22–5.81)
WBC: 9.3 10*3/uL (ref 4.0–10.5)

## 2012-01-12 MED ORDER — ZOLPIDEM TARTRATE 5 MG PO TABS
10.0000 mg | ORAL_TABLET | Freq: Every day | ORAL | Status: DC
  Administered 2012-01-12: 10 mg via ORAL
  Filled 2012-01-12 (×2): qty 2

## 2012-01-12 MED ORDER — ENOXAPARIN SODIUM 30 MG/0.3ML ~~LOC~~ SOLN
30.0000 mg | Freq: Two times a day (BID) | SUBCUTANEOUS | Status: DC
  Administered 2012-01-14 – 2012-01-16 (×4): 30 mg via SUBCUTANEOUS
  Filled 2012-01-12 (×8): qty 0.3

## 2012-01-12 MED ORDER — OXYCODONE HCL 5 MG PO TABS
5.0000 mg | ORAL_TABLET | ORAL | Status: DC | PRN
  Administered 2012-01-12 – 2012-01-14 (×5): 20 mg via ORAL
  Administered 2012-01-14: 5 mg via ORAL
  Administered 2012-01-14: 20 mg via ORAL
  Filled 2012-01-12 (×7): qty 4

## 2012-01-12 MED ORDER — ALUM & MAG HYDROXIDE-SIMETH 200-200-20 MG/5ML PO SUSP: 15.0000 mL | ORAL | Status: DC | PRN

## 2012-01-12 MED ORDER — TRAMADOL HCL 50 MG PO TABS: 50.0000 mg | ORAL_TABLET | Freq: Four times a day (QID) | ORAL | Status: DC | PRN

## 2012-01-12 MED ORDER — NITROGLYCERIN 0.4 MG SL SUBL
0.4000 mg | SUBLINGUAL_TABLET | SUBLINGUAL | Status: DC | PRN
Start: 2012-01-12 — End: 2012-01-14

## 2012-01-12 NOTE — Progress Notes (Signed)
Physical Therapy Treatment Patient Details Name: Jacob Alvarado MRN: [id number] DOB: [birth year] Today's Date: 01/12/2012 Time: 4098-1191 PT Time Calculation (min): 75 min  PT Assessment / Plan / Recommendation Comments on Treatment Session  Pt agreeable to pariticipate this afternoon.  Pt's mobility improved, Pt transferred from bed <--> wheelchair. Planned to teach pt to propell wheelchair but unable secondary to pt having several "syncopal" episodes in chair.  Pt's eyes would roll up in his head and he would become unrespoonsive.  Pt consistently responded to painful stimuls but was unable to respond to verbal stimuls.  BP in sitting was 135/77 sitting in wheelchair.  Returned pt to bed.  Pt was much more alert once back in bed  Explained rehab process to pt and his family.  Pt planning to d/c to home with HHPT and wheelchair.        Follow Up Recommendations  Home health PT     Does the patient have the potential to tolerate intense rehabilitation     Barriers to Discharge        Equipment Recommendations  Wheelchair (measurements PT);Wheelchair cushion (measurements PT)    Recommendations for Other Services    Frequency Min 5X/week   Plan Discharge plan needs to be updated;Frequency remains appropriate    Precautions / Restrictions Precautions Precautions: Fall Required Braces or Orthoses: Other Brace/Splint (Sling for L UE) Other Brace/Splint: wound vac on LLE and sling on L UE Restrictions Weight Bearing Restrictions: Yes RUE Weight Bearing: Weight bearing as tolerated LUE Weight Bearing: Non weight bearing LLE Weight Bearing: Non weight bearing   Pertinent Vitals/Pain 10/10 pain in LLE and LUE.  Pt medicated prior to session.      Mobility  Bed Mobility Bed Mobility: Supine to Sit;Sitting - Scoot to [place] of Bed;Sit to Supine;Scooting to [facility] Supine to Sit: 3: Mod assist;HOB elevated Supine to Sit: Patient Percentage: 70% Sitting - Scoot to Edge of Bed: 3:  Mod assist Sitting - Scoot to Edge of Bed: Patient Percentage: 70% Sit to Supine: 3: Mod assist Sit to Supine: Patient Percentage: 70% Scooting to HOB: 3: Mod assist Details for Bed Mobility Assistance: Assist to manage LLE. cues for sequencing, pt able to pull himself up in bed with Right UE on bedrail and RLE with bed in trendelenburg position.   Transfers Transfers: Sit to Stand;Stand to Sit;Squat Pivot Transfers Sit to Stand: 3: Mod assist;From bed;From elevated surface;From chair/3-in-1;With upper extremity assist Stand to Sit: 3: Mod assist;To bed;To chair/3-in-1 Squat Pivot Transfers: 3: Mod assist;With upper extremity assistance Details for Transfer Assistance: Assist to manage L LE, cues for R hand placement, assist for controlled descent to chair/bed. Ambulation/Gait Ambulation/Gait Assistance: Not tested (comment) Wheelchair Mobility Wheelchair Mobility: No    Exercises     PT Diagnosis:    PT Problem List:   PT Treatment Interventions:     PT Goals Acute Rehab PT Goals PT Goal Formulation: With patient Time For Goal Achievement: 01/23/12 Potential to Achieve Goals: Fair Pt will go Supine/Side to Sit: with min assist PT Goal: Supine/Side to Sit - Progress: Progressing toward goal Pt will Sit at Edge of Bed: with supervision;1-2 min PT Goal: Sit at Edge Of Bed - Progress: Progressing toward goal Pt will go Sit to Supine/Side: with min assist PT Goal: Sit to Supine/Side - Progress: Progressing toward goal Pt will go Sit to Stand: with mod assist PT Goal: Sit to Stand - Progress: Progressing toward goal Pt will go Stand to Sit: with  mod assist PT Goal: Stand to Sit - Progress: Progressing toward goal Pt will Transfer Bed to Chair/Chair to Bed: with mod assist PT Transfer Goal: Bed to Chair/Chair to Bed - Progress: Progressing toward goal Pt will Stand: with min assist;1 - 2 min PT Goal: Stand - Progress: Progressing toward goal Pt will Ambulate: 1 - 15 feet;with +2  total assist PT Goal: Ambulate - Progress: Not progressing  Visit Information  Last PT Received On: 01/12/12 Assistance Needed: +2 Reason Eval/Treat Not Completed:  (Pt refusal to participate. )    Subjective Data  Subjective: pt agreedm to parti Patient Stated Goal: Go home instead of rehab.     Cognition  Overall Cognitive Status: Appears within functional limits for tasks assessed/performed Arousal/Alertness: Awake/alert Orientation Level: Appears intact for tasks assessed Behavior During Session: [facility] for tasks performed    Balance  Balance Balance Assessed: No Static Sitting Balance Static Sitting - Balance Support: Right upper extremity supported Static Sitting - Level of Assistance: 5: Stand by assistance Static Sitting - Comment/# of Minutes: Pt sat on EOB approximately 10 minutes with no significant LOB.  Pt c/o feeling light headed which did not improve with time.    End of Session PT - End of Session Equipment Utilized During Treatment: Gait belt (sling) Activity Tolerance: Patient limited by pain;Treatment limited secondary to medical complications (Comment) Patient left: in bed;with call bell/phone within reach;with bed alarm set;with family/visitor present Nurse Communication: Mobility status;Precautions;Weight bearing status   GP     Jasara Corrigan 01/12/2012, 6:08 PM
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
## 2012-01-12 NOTE — Progress Notes (Signed)
I met with patient and his Dad at bedside. Discussed bed to chair transfers and ability to participate and do 3 hrs per day therapy on inpt rehab. Also discussed the eventual need for 24/7 assist at home. He will discuss with family and he asked me to follow up later today. [phone]

## 2012-01-12 NOTE — Progress Notes (Signed)
Patient not able to participate intensively at a level for inpatient rehab admission at this time. I will follow his progress, but recommend arranging home health therapy and equipment. He is aware he will need 24/7 assist of family which he is reluctant to ask them for. I will follow his progress to see if his participation changes. [phone]

## 2012-01-12 NOTE — Progress Notes (Addendum)
Occupational Therapy Treatment Patient Details Name: Jacob Alvarado MRN: [id number] DOB: 19-May-1980 Today's Date: 01/12/2012 Time: 5784-6962 OT Time Calculation (min): 28 min  OT Assessment / Plan / Recommendation Comments on Treatment Session Treatment focused on family education regarding LUE elevation and L hand ROM. Ice applied LUE to decrease edema. Pt stated he knew all of this, however, arm was not elevated on arrival. Attempted to engage pt in therapeutic activity L hand but pt stated he had already moved his hand 3 times and "wasn't doing it no more". Attempted to engage pt in bed mobility but pt declined. Discussed D/C options with family, who states pt's Mom can take him home, but has 10 STE. Friends could bump pt up/down steps in w/c and could provide 24/7 S. Friends looking at other options. Feel participation at CIR would be guarded. Pt stated he just needed to "lie still, heal and have everyone stop bothering him". If pt has appropriate plan for home D/C, feel HHOT/PT will be better option based on today's session.    Follow Up Recommendations  Home health OT;Supervision/Assistance - 24 hour;Other (comment)    Barriers to Discharge   10 STE    Equipment Recommendations  3 in 1 bedside commode - drop arm;Wheelchair (measurements OT) with ELR and removable armrests;Wheelchair cushion (measurements OT);Tub/shower bench    Recommendations for Other Services  none  Frequency Min 2X/week   Plan Discharge plan needs to be updated    Precautions / Restrictions Precautions Precautions: Fall Restrictions RUE Weight Bearing: Weight bearing as tolerated LUE Weight Bearing: Non weight bearing LLE Weight Bearing: Non weight bearing   Pertinent Vitals/Pain C/o pain but would not rate. Apparently pre medicated    ADL  Transfers/Ambulation Related to ADLs: Pt just back to bed    OT Diagnosis:    OT Problem List:   OT Treatment Interventions:     OT Goals Acute Rehab OT  Goals OT Goal Formulation: With patient Time For Goal Achievement: 01/23/12 Potential to Achieve Goals: Good ADL Goals Pt Will Perform Eating: Sitting, edge of bed;with modified independence;with adaptive utensils Pt Will Perform Grooming: with modified independence;with adaptive equipment;Sitting at sink Pt Will Perform Upper Body Dressing: with modified independence;Sitting, bed;Sitting, chair Pt Will Perform Lower Body Dressing: with min assist;Sit to stand from bed;Sit to stand from chair Pt Will Transfer to Toilet: with min assist;Stand pivot transfer;with DME Pt Will Perform Toileting - Clothing Manipulation: with supervision;Standing Pt Will Perform Toileting - Hygiene: Independently;Sitting on 3-in-1 or toilet Additional ADL Goal #1: Pt will be Mod I with all bed mobility in prep for OOB functional activities. Arm Goals Pt Will Perform AROM: Left upper extremity;with supervision, verbal cues required/provided;to maintain range of motion;1 set;Other (comment) (L hand) Arm Goal: AROM - Progress: Goal set today Additional Arm Goal #1: Pt will keep LUE elevated on 3 pillow and verbalize understanding of edema management. Arm Goal: Additional Goal #1 - Progress: Goal set today  Visit Information  Last OT Received On: 01/12/12 Assistance Needed: +2    Subjective Data      Prior Functioning       Cognition  Overall Cognitive Status: Appears within functional limits for tasks assessed/performed Arousal/Alertness: Awake/alert Orientation Level: Appears intact for tasks assessed Behavior During Session: Other (comment) Cognition - Other Comments: not feeling well    Mobility  Shoulder Instructions Transfers Transfers: Not assessed       Exercises      Balance     End of  Session OT - End of Session Activity Tolerance: Patient limited by pain;Other (comment) (c/o nausea) Patient left: in bed;with call bell/phone within reach;with family/visitor present Nurse  Communication: Other (comment) (nauseated)  GO     Lorin Hauck,HILLARY 01/12/2012, 12:51 PM Luisa Dago, OTR/L  [phone] 01/12/2012
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
## 2012-01-12 NOTE — Progress Notes (Signed)
Pt c/o CP again. Performed 12 lead EKG and paged PA-Megan Dort.

## 2012-01-12 NOTE — Progress Notes (Signed)
I saw and examined the patient with Mr. Paul, communicating the findings and plan noted above.  Gwen Edler, MD [facility] [phone] (p)  

## 2012-01-12 NOTE — Progress Notes (Signed)
Patient c/o Chest pain.  Patient stated it was probably due to indigestion.  Performed 12 lead EKG.  Notified Trauma PA, Aris Georgia - she responded that she will come to see patient to perform physical assessment

## 2012-01-12 NOTE — Progress Notes (Signed)
C/O sweats with pain meds - will change Requests medicine to help him sleep Patient examined and I agree with the assessment and plan  Violeta Gelinas, MD, MPH, FACS Pager: [phone]  01/12/2012 10:46 AM

## 2012-01-13 ENCOUNTER — Encounter (HOSPITAL_COMMUNITY): Payer: Self-pay | Admitting: Certified Registered Nurse Anesthetist

## 2012-01-13 ENCOUNTER — Encounter (HOSPITAL_COMMUNITY): Admission: EM | Disposition: A | Discharge status: Home / Self Care | Payer: Self-pay | Source: Home / Self Care

## 2012-01-13 ENCOUNTER — Inpatient Hospital Stay (HOSPITAL_COMMUNITY): Payer: Medicaid Other | Admitting: Certified Registered Nurse Anesthetist

## 2012-01-13 HISTORY — PX: INCISION AND DRAINAGE OF WOUND: SHX1803

## 2012-01-13 HISTORY — PX: SKIN SPLIT GRAFT: SHX444

## 2012-01-13 LAB — CBC
HCT: 24.1 % — ABNORMAL LOW (ref 39.0–52.0)
MCH: 28.2 pg (ref 26.0–34.0)
MCV: 80.9 fL (ref 78.0–100.0)
RBC: 2.98 MIL/uL — ABNORMAL LOW (ref 4.22–5.81)
WBC: 10.3 10*3/uL (ref 4.0–10.5)

## 2012-01-13 LAB — BASIC METABOLIC PANEL
BUN: 11 mg/dL (ref 6–23)
CO2: 25 mEq/L (ref 19–32)
Calcium: 8.6 mg/dL (ref 8.4–10.5)
Chloride: 98 mEq/L (ref 96–112)
Creatinine, Ser: 0.75 mg/dL (ref 0.50–1.35)

## 2012-01-13 SURGERY — IRRIGATION AND DEBRIDEMENT WOUND
Anesthesia: General | Site: Leg Lower | Laterality: Left | Wound class: Clean

## 2012-01-13 MED ORDER — PANTOPRAZOLE SODIUM 20 MG PO TBEC
20.0000 mg | DELAYED_RELEASE_TABLET | Freq: Every day | ORAL | Status: DC
  Administered 2012-01-13 – 2012-01-16 (×4): 20 mg via ORAL
  Filled 2012-01-13 (×4): qty 1

## 2012-01-13 MED ORDER — FENTANYL CITRATE 0.05 MG/ML IJ SOLN
INTRAMUSCULAR | Status: DC | PRN
  Administered 2012-01-13: 100 ug via INTRAVENOUS

## 2012-01-13 MED ORDER — PHENYLEPHRINE HCL 10 MG/ML IJ SOLN
INTRAMUSCULAR | Status: DC | PRN
  Administered 2012-01-13: 40 ug via INTRAVENOUS
  Administered 2012-01-13: 80 ug via INTRAVENOUS

## 2012-01-13 MED ORDER — FAMOTIDINE 20 MG PO TABS: 20.0000 mg | ORAL_TABLET | Freq: Two times a day (BID) | ORAL | Status: DC | PRN

## 2012-01-13 MED ORDER — ONDANSETRON HCL 4 MG/2ML IJ SOLN: 4.0000 mg | Freq: Once | INTRAMUSCULAR | Status: DC | PRN

## 2012-01-13 MED ORDER — SODIUM CHLORIDE 0.9 % IR SOLN
Status: DC | PRN
  Administered 2012-01-13: 1000 mL

## 2012-01-13 MED ORDER — MINERAL OIL LIGHT 100 % EX OIL
TOPICAL_OIL | CUTANEOUS | Status: AC
  Filled 2012-01-13: qty 25

## 2012-01-13 MED ORDER — ACETAMINOPHEN 10 MG/ML IV SOLN
1000.0000 mg | Freq: Once | INTRAVENOUS | Status: AC | PRN
  Administered 2012-01-13: 1000 mg via INTRAVENOUS

## 2012-01-13 MED ORDER — MINERAL OIL LIGHT 100 % EX OIL
TOPICAL_OIL | CUTANEOUS | Status: DC | PRN
  Administered 2012-01-13: 1 via TOPICAL

## 2012-01-13 MED ORDER — ALBUMIN HUMAN 5 % IV SOLN
INTRAVENOUS | Status: DC | PRN
  Administered 2012-01-13: 09:00:00 via INTRAVENOUS

## 2012-01-13 MED ORDER — LACTATED RINGERS IV SOLN
INTRAVENOUS | Status: DC | PRN
  Administered 2012-01-13: 07:00:00 via INTRAVENOUS

## 2012-01-13 MED ORDER — HYDROMORPHONE HCL PF 1 MG/ML IJ SOLN
0.2500 mg | INTRAMUSCULAR | Status: DC | PRN
  Administered 2012-01-13 (×2): 0.5 mg via INTRAVENOUS

## 2012-01-13 MED ORDER — BUPIVACAINE-EPINEPHRINE (PF) 0.5% -1:200000 IJ SOLN
INTRAMUSCULAR | Status: AC
  Filled 2012-01-13: qty 10

## 2012-01-13 MED ORDER — ONDANSETRON HCL 4 MG/2ML IJ SOLN
INTRAMUSCULAR | Status: DC | PRN
  Administered 2012-01-13: 4 mg via INTRAVENOUS

## 2012-01-13 MED ORDER — ACETAMINOPHEN 10 MG/ML IV SOLN
INTRAVENOUS | Status: AC
  Filled 2012-01-13: qty 100

## 2012-01-13 MED ORDER — SUCRALFATE 1 GM/10ML PO SUSP
1.0000 g | Freq: Three times a day (TID) | ORAL | Status: DC | PRN
  Filled 2012-01-13 (×3): qty 10

## 2012-01-13 MED ORDER — LIDOCAINE HCL (CARDIAC) 20 MG/ML IV SOLN
INTRAVENOUS | Status: DC | PRN
  Administered 2012-01-13: 40 mg via INTRAVENOUS

## 2012-01-13 MED ORDER — BUPIVACAINE-EPINEPHRINE PF 0.5-1:200000 % IJ SOLN
INTRAMUSCULAR | Status: DC | PRN
  Administered 2012-01-13: 20 mL

## 2012-01-13 MED ORDER — PROPOFOL 10 MG/ML IV BOLUS
INTRAVENOUS | Status: DC | PRN
  Administered 2012-01-13: 200 mg via INTRAVENOUS

## 2012-01-13 SURGICAL SUPPLY — 77 items
BALL CTTN LRG ABS STRL LF (GAUZE/BANDAGES/DRESSINGS)
BANDAGE ELASTIC 4 VELCRO ST LF (GAUZE/BANDAGES/DRESSINGS) ×2 IMPLANT
BANDAGE ELASTIC 6 VELCRO ST LF (GAUZE/BANDAGES/DRESSINGS) ×2 IMPLANT
BANDAGE GAUZE ELAST BULKY 4 IN (GAUZE/BANDAGES/DRESSINGS) ×2 IMPLANT
BLADE DERMATOME SS (BLADE) ×2 IMPLANT
BLADE SURG ROTATE 9660 (MISCELLANEOUS) IMPLANT
BNDG COHESIVE 4X5 TAN STRL (GAUZE/BANDAGES/DRESSINGS) ×2 IMPLANT
BRUSH SCRUB DISP (MISCELLANEOUS) ×4 IMPLANT
CANISTER SUCTION 2500CC (MISCELLANEOUS) IMPLANT
CANISTER WOUND CARE 500ML ATS (WOUND CARE) ×2 IMPLANT
CLOTH BEACON ORANGE TIMEOUT ST (SAFETY) ×2 IMPLANT
COTTONBALL LRG STERILE PKG (GAUZE/BANDAGES/DRESSINGS) IMPLANT
COVER SURGICAL LIGHT HANDLE (MISCELLANEOUS) ×2 IMPLANT
DEPRESSOR TONGUE BLADE STERILE (MISCELLANEOUS) ×2 IMPLANT
DERMACARRIERS GRAFT 1 TO 1.5 (DISPOSABLE) ×2
DRAPE C-ARMOR (DRAPES) IMPLANT
DRAPE EXTREMITY T 121X128X90 (DRAPE) ×2 IMPLANT
DRAPE PROXIMA HALF (DRAPES) IMPLANT
DRAPE U-SHAPE 47X51 STRL (DRAPES) ×2 IMPLANT
DRSG ADAPTIC 3X8 NADH LF (GAUZE/BANDAGES/DRESSINGS) ×4 IMPLANT
DRSG MEPITEL 4X7.2 (GAUZE/BANDAGES/DRESSINGS) ×2 IMPLANT
DRSG PAD ABDOMINAL 8X10 ST (GAUZE/BANDAGES/DRESSINGS) ×2 IMPLANT
DRSG VAC ATS MED SENSATRAC (GAUZE/BANDAGES/DRESSINGS) ×2 IMPLANT
ELECT REM PT RETURN 9FT ADLT (ELECTROSURGICAL) ×2
ELECTRODE REM PT RTRN 9FT ADLT (ELECTROSURGICAL) ×1 IMPLANT
GAUZE SPONGE 4X4 16PLY XRAY LF (GAUZE/BANDAGES/DRESSINGS) ×2 IMPLANT
GAUZE XEROFORM 5X9 LF (GAUZE/BANDAGES/DRESSINGS) ×4 IMPLANT
GLOVE BIO SURGEON STRL SZ 6.5 (GLOVE) ×2 IMPLANT
GLOVE BIO SURGEON STRL SZ7.5 (GLOVE) ×2 IMPLANT
GLOVE BIO SURGEON STRL SZ8 (GLOVE) ×4 IMPLANT
GLOVE BIO SURGEON STRL SZ8.5 (GLOVE) ×8 IMPLANT
GLOVE BIOGEL PI IND STRL 7.5 (GLOVE) ×1 IMPLANT
GLOVE BIOGEL PI IND STRL 8 (GLOVE) ×1 IMPLANT
GLOVE BIOGEL PI INDICATOR 7.5 (GLOVE) ×1
GLOVE BIOGEL PI INDICATOR 8 (GLOVE) ×1
GLOVE SURG SS PI 8.5 STRL IVOR (GLOVE) ×2
GLOVE SURG SS PI 8.5 STRL STRW (GLOVE) ×2 IMPLANT
GOWN PREVENTION PLUS XLARGE (GOWN DISPOSABLE) ×2 IMPLANT
GOWN PREVENTION PLUS XXLARGE (GOWN DISPOSABLE) IMPLANT
GOWN STRL NON-REIN LRG LVL3 (GOWN DISPOSABLE) ×2 IMPLANT
GOWN STRL REIN 2XL XLG LVL4 (GOWN DISPOSABLE) ×4 IMPLANT
GRAFT DERMACARRIERS 1 TO 1.5 (DISPOSABLE) ×1 IMPLANT
HANDPIECE INTERPULSE COAX TIP (DISPOSABLE)
KIT BASIN OR (CUSTOM PROCEDURE TRAY) ×2 IMPLANT
KIT ROOM TURNOVER OR (KITS) ×2 IMPLANT
MANIFOLD NEPTUNE II (INSTRUMENTS) ×2 IMPLANT
NEEDLE HYPO 25GX1X1/2 BEV (NEEDLE) ×2 IMPLANT
NS IRRIG 1000ML POUR BTL (IV SOLUTION) ×2 IMPLANT
PACK GENERAL/GYN (CUSTOM PROCEDURE TRAY) ×2 IMPLANT
PAD ARMBOARD 7.5X6 YLW CONV (MISCELLANEOUS) ×2 IMPLANT
PAD CAST 4YDX4 CTTN HI CHSV (CAST SUPPLIES) IMPLANT
PADDING CAST COTTON 4X4 STRL (CAST SUPPLIES)
PADDING CAST COTTON 6X4 STRL (CAST SUPPLIES) IMPLANT
PENCIL BUTTON HOLSTER BLD 10FT (ELECTRODE) IMPLANT
SET HNDPC FAN SPRY TIP SCT (DISPOSABLE) IMPLANT
SLEEVE SURGEON STRL (DRAPES) ×2 IMPLANT
SPONGE GAUZE 4X4 12PLY (GAUZE/BANDAGES/DRESSINGS) IMPLANT
SPONGE LAP 18X18 X RAY DECT (DISPOSABLE) IMPLANT
SPONGE SCRUB IODOPHOR (GAUZE/BANDAGES/DRESSINGS) IMPLANT
STAPLER VISISTAT (STAPLE) ×2 IMPLANT
STAPLER VISISTAT 35W (STAPLE) IMPLANT
STOCKINETTE IMPERVIOUS LG (DRAPES) ×2 IMPLANT
SUT ETHILON 2 0 FS 18 (SUTURE) ×2 IMPLANT
SUT ETHILON 3 0 FSL (SUTURE) IMPLANT
SUT ETHILON 3 0 PS 1 (SUTURE) ×4 IMPLANT
SUT PDS AB 2-0 CT1 27 (SUTURE) ×2 IMPLANT
SUT PROLENE 0 CT 2 (SUTURE) ×2 IMPLANT
SUT PROLENE 5 0 PS 2 (SUTURE) ×4 IMPLANT
SUT VIC AB 2-0 CT1 27 (SUTURE) ×2
SUT VIC AB 2-0 CT1 TAPERPNT 27 (SUTURE) ×1 IMPLANT
SYR CONTROL 10ML LL (SYRINGE) ×2 IMPLANT
TOWEL OR 17X24 6PK STRL BLUE (TOWEL DISPOSABLE) ×2 IMPLANT
TOWEL OR 17X26 10 PK STRL BLUE (TOWEL DISPOSABLE) ×4 IMPLANT
TUBE CONNECTING 12X1/4 (SUCTIONS) IMPLANT
UNDERPAD 30X30 INCONTINENT (UNDERPADS AND DIAPERS) ×4 IMPLANT
WATER STERILE IRR 1000ML POUR (IV SOLUTION) IMPLANT
YANKAUER SUCT BULB TIP NO VENT (SUCTIONS) ×2 IMPLANT

## 2012-01-13 NOTE — Transfer of Care (Signed)
Immediate Anesthesia Transfer of Care Note  Patient: Jacob Alvarado  Procedure(s) Performed: Procedure(s) (LRB) with comments: IRRIGATION AND DEBRIDEMENT WOUND (Left) - LAYERED CLOSURE AND STSG LEFT LEG SKIN GRAFT SPLIT THICKNESS (Left)  Patient Location: PACU  Anesthesia Type:General  Level of Consciousness: awake, alert , oriented and patient cooperative  Airway & Oxygen Therapy: Patient Spontanous Breathing and Patient connected to nasal cannula oxygen  Post-op Assessment: Report given to PACU RN, Post -op Vital signs reviewed and stable and Patient moving all extremities  Post vital signs: Reviewed and stable  Complications: No apparent anesthesia complications
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
## 2012-01-13 NOTE — Preoperative (Signed)
Beta Blockers   Reason not to administer Beta Blockers:Not Applicable 

## 2012-01-13 NOTE — Brief Op Note (Signed)
01/07/2012 - 01/13/2012  10:14 AM  PATIENT:  Jacob Alvarado  31 y.o. male  PRE-OPERATIVE DIAGNOSIS:  OPEN WOUNDS LEFT LEG  POST-OPERATIVE DIAGNOSIS:  OPEN WOUNDS LEFT LEG  PROCEDURE:  Procedure(s) (LRB) with comments: IRRIGATION AND DEBRIDEMENT WOUND (Left) - LAYERED CLOSURE AND STSG LEFT LEG SKIN GRAFT SPLIT THICKNESS (Left) 7x15cm (105 square cm) Large wound vac  SURGEON:  Surgeon(s) and Role:    * Budd Palmer, MD - Primary  PHYSICIAN ASSISTANT: Montez Morita, [facility]  ANESTHESIA:   general  EBL:  Total I/O In: 250 [IV Piggyback:250] Out: -   BLOOD ADMINISTERED:none  DRAINS: none   LOCAL MEDICATIONS USED:  NONE  SPECIMEN:  No Specimen  DISPOSITION OF SPECIMEN:  N/A  COUNTS:  YES  TOURNIQUET:  * No tourniquets in log *  DICTATION: .Other Dictation: Dictation Number [id number]  PLAN OF CARE: Admit to inpatient   PATIENT DISPOSITION:  PACU - hemodynamically stable.   Delay start of Pharmacological VTE agent (>24hrs) due to surgical blood loss or risk of bleeding: no
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
## 2012-01-13 NOTE — Anesthesia Postprocedure Evaluation (Signed)
  Anesthesia Post-op Note  Patient: Jacob Alvarado  Procedure(s) Performed: Procedure(s) (LRB) with comments: IRRIGATION AND DEBRIDEMENT WOUND (Left) - LAYERED CLOSURE AND STSG LEFT LEG SKIN GRAFT SPLIT THICKNESS (Left)  Patient Location: PACU  Anesthesia Type:General  Level of Consciousness: awake, alert  and oriented  Airway and Oxygen Therapy: Patient Spontanous Breathing  Post-op Pain: mild  Post-op Assessment: Post-op Vital signs reviewed, Patient's Cardiovascular Status Stable, Respiratory Function Stable, Patent Airway and Pain level controlled  Post-op Vital Signs: stable  Complications: No apparent anesthesia complications

## 2012-01-13 NOTE — Anesthesia Procedure Notes (Addendum)
Date/Time: 01/13/2012 8:33 AM Performed by: Kirt Boys P   Procedure Name: LMA Insertion Date/Time: 01/13/2012 8:07 AM Performed by: Rogelia Boga Pre-anesthesia Checklist: Patient identified, Emergency Drugs available, Suction available, Patient being monitored and Timeout performed Patient Re-evaluated:Patient Re-evaluated prior to inductionOxygen Delivery Method: Circle system utilized Preoxygenation: Pre-oxygenation with 100% oxygen Intubation Type: IV induction LMA Size: 4.0 Number of attempts: 1 Placement Confirmation: positive ETCO2 and breath sounds checked- equal and bilateral Tube secured with: Tape Dental Injury: Teeth and Oropharynx as per pre-operative assessment

## 2012-01-13 NOTE — Progress Notes (Signed)
01/13/12 0738  PT Visit Information  Last PT Received On 01/13/12  Reason Eval/Treat Not Completed Other (comment) (Patient in OR. Will check on in AM)   01/13/2012 Fredrich Birks PTA [phone] pager [phone] office

## 2012-01-14 LAB — CBC
HCT: 24.3 % — ABNORMAL LOW (ref 39.0–52.0)
Hemoglobin: 8.6 g/dL — ABNORMAL LOW (ref 13.0–17.0)
RDW: 13.8 % (ref 11.5–15.5)
WBC: 11.7 10*3/uL — ABNORMAL HIGH (ref 4.0–10.5)

## 2012-01-14 MED ORDER — MAGNESIUM CITRATE PO SOLN
1.0000 | Freq: Once | ORAL | Status: AC
  Administered 2012-01-14: 1 via ORAL
  Filled 2012-01-14: qty 296

## 2012-01-14 MED ORDER — DOCUSATE SODIUM 100 MG PO CAPS
200.0000 mg | ORAL_CAPSULE | Freq: Two times a day (BID) | ORAL | Status: DC
  Administered 2012-01-14 – 2012-01-16 (×4): 200 mg via ORAL
  Filled 2012-01-14 (×4): qty 2

## 2012-01-14 NOTE — Progress Notes (Signed)
Physical Therapy Treatment Patient Details Name: Jacob Alvarado MRN: [id number] DOB: [birth year] Today's Date: 01/14/2012 Time: 4098-1191 PT Time Calculation (min): 24 min  PT Assessment / Plan / Recommendation Comments on Treatment Session  Pt with complaints of mild nausea when seated at edge of bed. Was able to proceed with PT/OT treatment without emesis and with VSS. No syncopal episodes this session. Would benefit pt to try hemiwalker with next PT session with transfers to decr assistance/burden of care at home. Also, needs to attempt wheelchair education/mobiltiy again next session.    Follow Up Recommendations  Home health PT           Equipment Recommendations  Wheelchair cushion (measurements PT);Wheelchair (measurements PT)       Frequency Min 5X/week   Plan Discharge plan remains appropriate;Frequency remains appropriate    Precautions / Restrictions Precautions Required Braces or Orthoses: Other Brace/Splint Other Brace/Splint: wound vac to left LE and sling for left UE Restrictions LUE Weight Bearing: Non weight bearing LLE Weight Bearing: Non weight bearing    Pertinent Vitals/Pain Reports his pain as 8/10 in left arm and leg. RN aware and pt has been premedicated. VSS with session today.    Mobility  Bed Mobility Supine to Sit: 4: Min assist;HOB flat (no rails) Supine to Sit: Patient Percentage: 90% Sitting - Scoot to Edge of Bed: 4: Min assist (for left LE only ) Sitting - Scoot to [place] of Bed: Patient Percentage: 90% Details for Bed Mobility Assistance: assist for L LE management with cues to use L UE to assist with trunk movement into sitting. Pt able to bring trunk upright I'ly with bed flat and no rails. Pt provided minimal assist with advaning left LE to edge of bed with max cues. Pt able to use right side of body to scoot to edge of bed with cues . Increased time needed with all bed mobility. Transfers Sit to Stand: With upper extremity  assist;From bed;1: +2 Total assist Stand to Sit: To chair/3-in-1;With upper extremity assist;1: +2 Total assist Squat Pivot Transfers: 1: +2 Total assist Squat Pivot Transfers: Patient Percentage: 70% Details for Transfer Assistance: pt required min assist to stand from bed and to sit in chair with second person for L LE management. Pt used right arm on chair armrest and pivoted on right leg with mod assist for balance and total assist of second person to manage L LE. Pt required cues on technique and encouragement to participate.             PT Goals Acute Rehab PT Goals PT Goal: Supine/Side to Sit - Progress: Progressing toward goal PT Goal: Sit at [facility] Bed - Progress: Progressing toward goal (most likely met, has not been formally tested) PT Goal: Sit to Stand - Progress: Progressing toward goal PT Goal: Stand to Sit - Progress: Progressing toward goal PT Transfer Goal: Bed to Chair/Chair to Bed - Progress: Progressing toward goal PT Goal: Stand - Progress: Progressing toward goal  Visit Information  Last PT Received On: 01/14/12 Assistance Needed: +2 PT/OT Co-Evaluation/Treatment: Yes    Subjective Data  Subjective: Pt agreeable to out of bed to chair after max encouragement.   Cognition  Overall Cognitive Status: Appears within functional limits for tasks assessed/performed Arousal/Alertness: Awake/alert Orientation Level: Appears intact for tasks assessed Behavior During Session: [facility] for tasks performed       End of Session PT - End of Session Equipment Utilized During Treatment: Gait belt Activity Tolerance: Patient tolerated treatment  well;Patient limited by pain Patient left: in chair;with call bell/phone within reach;with family/visitor present Nurse Communication: Mobility status;Weight bearing status   GP     Sallyanne Kuster 01/14/2012, 2:52 PM  Sallyanne Kuster, PTA Office- [phone]

## 2012-01-14 NOTE — Progress Notes (Signed)
Occupational Therapy Treatment Patient Details Name: Jacob Alvarado MRN: [id number] DOB: 1980/09/24 Today's Date: 01/14/2012 Time: 2440-1027 OT Time Calculation (min): 24 min  OT Assessment / Plan / Recommendation Comments on Treatment Session Co treat with PT for functional mobility. Pt requires reassurance during session regarding mobility. Pt making progress. Need to train family regarding transfers and mgnt of pt concerning self care. Discussed with friend who came in at end of session. He states he will be here in am for education.    Follow Up Recommendations  Home health OT;Supervision/Assistance - 24 hour;Other (comment)    Barriers to Discharge   none    Equipment Recommendations  3 in 1 bedside comode;Wheelchair (measurements OT);Wheelchair cushion (measurements OT);Tub/shower bench    Recommendations for Other Services  none  Frequency Min 3X/week   Plan Discharge plan remains appropriate    Precautions / Restrictions Precautions Precautions: Fall Required Braces or Orthoses: Other Brace/Splint Other Brace/Splint: wound vac to left LE and sling for left UE Restrictions Weight Bearing Restrictions: Yes RUE Weight Bearing: Weight bearing as tolerated LUE Weight Bearing: Non weight bearing LLE Weight Bearing: Non weight bearing   Pertinent Vitals/Pain 5    ADL  Toilet Transfer: Set up;Other (comment) (taught pt how to use urinal when up in chair) Transfers/Ambulation Related to ADLs: +2 for mgnt of LLE. squat pivot tranfer    OT Diagnosis:    OT Problem List:   OT Treatment Interventions:     OT Goals Acute Rehab OT Goals OT Goal Formulation: With patient Time For Goal Achievement: 01/23/12 Potential to Achieve Goals: Good ADL Goals Pt Will Perform Eating: Sitting, edge of bed;with modified independence;with adaptive utensils ADL Goal: Eating - Progress: Progressing toward goals Pt Will Perform Grooming: with modified independence;with adaptive  equipment;Sitting at sink ADL Goal: Grooming - Progress: Progressing toward goals Pt Will Perform Upper Body Dressing: with modified independence;Sitting, bed;Sitting, chair Pt Will Perform Lower Body Dressing: with min assist;Sit to stand from bed;Sit to stand from chair Pt Will Transfer to Toilet: with min assist;Stand pivot transfer;with DME ADL Goal: Toilet Transfer - Progress: Progressing toward goals Pt Will Perform Toileting - Clothing Manipulation: with supervision;Standing ADL Goal: Toileting - Clothing Manipulation - Progress: Progressing toward goals Pt Will Perform Toileting - Hygiene: Independently;Sitting on 3-in-1 or toilet ADL Goal: Toileting - Hygiene - Progress: Progressing toward goals Additional ADL Goal #1: Pt will be Mod I with all bed mobility in prep for OOB functional activities. ADL Goal: Additional Goal #1 - Progress: Progressing toward goals Arm Goals Pt Will Perform AROM: Left upper extremity;with supervision, verbal cues required/provided;to maintain range of motion;1 set;Other (comment) Additional Arm Goal #1: Pt will keep LUE elevated on 3 pillow and verbalize understanding of edema management.  Visit Information  Last OT Received On: 01/14/12 Assistance Needed: +2    Subjective Data      Prior Functioning       Cognition  Overall Cognitive Status: Appears within functional limits for tasks assessed/performed Arousal/Alertness: Awake/alert Orientation Level: Appears intact for tasks assessed Behavior During Session: [facility] for tasks performed    Mobility  Shoulder Instructions Bed Mobility Bed Mobility: Supine to Sit;Sitting - Scoot to [place] of Bed;Sit to Supine;Scooting to [facility] Supine to Sit: 4: Min assist;HOB flat Supine to Sit: Patient Percentage: 90% Sitting - Scoot to Edge of Bed: 4: Min assist Sitting - Scoot to [place] of Bed: Patient Percentage: 90% Details for Bed Mobility Assistance: assist for L LE management with cues to use  L UE to assist  with trunk movement into sitting. Pt able to bring trunk upright I'ly with bed flat and no rails. Pt provided minimal assist with advaning left LE to edge of bed with max cues. Pt able to use right side of body to scoot to edge of bed with cues . Increased time needed with all bed mobility. Transfers Transfers: Sit to Stand;Stand to Sit Sit to Stand: With upper extremity assist;From bed;1: +2 Total assist Sit to Stand: Patient Percentage: 70% Stand to Sit: 1: +2 Total assist;To chair/3-in-1 Stand to Sit: Patient Percentage: 70% Details for Transfer Assistance: +2 for mgnt of LLE (pt anxious during transfer)       Exercises      Balance  minA. Able to maintain WBS but needs assistance to support LLE   End of Session OT - End of Session Equipment Utilized During Treatment: Gait belt Activity Tolerance: Patient limited by pain Patient left: in chair;with call bell/phone within reach;with family/visitor present Nurse Communication: Mobility status;Precautions;Weight bearing status  GO     Alinda Egolf,HILLARY 01/14/2012, 3:44 PM [facility], OTR/L  [phone] 01/14/2012
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
## 2012-01-15 MED ORDER — MAGNESIUM CITRATE PO SOLN
1.0000 | Freq: Once | ORAL | Status: DC
  Filled 2012-01-15 (×2): qty 296

## 2012-01-15 MED ORDER — BISACODYL 10 MG RE SUPP: 10.0000 mg | Freq: Every day | RECTAL | Status: DC | PRN

## 2012-01-15 MED ORDER — ACETAMINOPHEN 325 MG PO TABS: 650.0000 mg | ORAL_TABLET | ORAL | Status: DC | PRN

## 2012-01-15 MED ORDER — TRAMADOL HCL 50 MG PO TABS: 50.0000 mg | ORAL_TABLET | Freq: Four times a day (QID) | ORAL | Status: DC | PRN

## 2012-01-15 MED ORDER — HYDROCODONE-ACETAMINOPHEN 5-325 MG PO TABS: 1.0000 | ORAL_TABLET | Freq: Four times a day (QID) | ORAL | Status: DC | PRN

## 2012-01-15 MED ORDER — FUROSEMIDE 10 MG/ML IJ SOLN
INTRAMUSCULAR | Status: AC
  Administered 2012-01-15: 10 mg
  Filled 2012-01-15: qty 2

## 2012-01-15 MED ORDER — FUROSEMIDE 10 MG/ML IJ SOLN
INTRAMUSCULAR | Status: AC
  Filled 2012-01-15: qty 2

## 2012-01-15 MED ORDER — TRAMADOL HCL 50 MG PO TABS
50.0000 mg | ORAL_TABLET | Freq: Four times a day (QID) | ORAL | Status: DC | PRN
  Administered 2012-01-16: 100 mg via ORAL
  Filled 2012-01-15 (×2): qty 2

## 2012-01-15 NOTE — Progress Notes (Addendum)
OT EVALUATION   01/15/12 0930  OT Visit Information  Last OT Received On 01/15/12  Assistance Needed +2  PT/OT Co-Evaluation/Treatment Yes  OT Time Calculation  OT Start Time 0918  OT Stop Time 0959  OT Time Calculation (min) 41 min  Precautions  Precautions Fall  Required Braces or Orthoses Other Brace/Splint  Other Brace/Splint wound vac to left LE and sling for left UE (L PRAFO on when in bed)  Restrictions  Weight Bearing Restrictions Yes  RUE Weight Bearing WBAT  LUE Weight Bearing NWB  LLE Weight Bearing NWB  ADL  Where Assessed - Upper Body Dressing Supine, head of bed flat  Lower Body Dressing Minimal assistance  Where Assessed - Lower Body Dressing Rolling right and/or left;Supine, head of bed flat  Toilet Transfer Simulated;Minimal assistance;Other (comment) (squat pivot)  Toilet Transfer Method Squat pivot  Toilet Transfer Equipment Other (comment) (bed - wc)  Toileting - Clothing Manipulation and Hygiene Modified independent  Where Assessed - Toileting Clothing Manipulation and Hygiene Supine, head of bed flat;Rolling right and/or left  Equipment Used Gait belt  Transfers/Ambulation Related to ADLs squat pivot with min [place] strong side  ADL Comments focused on ADL @ bed level. pts cousin educated in ADL and sling use. Observed transfer  Cognition  Overall Cognitive Status Appears within functional limits for tasks assessed/performed  Arousal/Alertness Awake/alert  Orientation Level Appears intact for tasks assessed  Behavior During Session [facility] for tasks performed  Bed Mobility  Bed Mobility Supine to Sit;Sitting - Scoot to Edge of Bed  Supine to Sit 4: Min assist;Other (comment) (for LLE)  Sitting - Scoot to Edge of Bed 4: Min assist  Details for Bed Mobility Assistance assist for L LE management with cues to use L UE to assist with trunk movement into sitting. Pt able to bring trunk upright I'ly with bed flat and no rails. Pt provided minimal assist with  advaning left LE to edge of bed with max cues. Pt able to use right side of body to scoot to edge of bed with cues . Increased time needed with all bed mobility.  Transfers  Transfers (squat pivot)  Details for Transfer Assistance educated pt on squat pivot transfer technique. NWB LLE. foot on floor. Pt able to bend knee  Exercises  Exercises Hand exercises;General Upper Extremity  General Exercises - Upper Extremity  Shoulder Flexion AAROM;5 reps  Wrist Flexion AROM;Left;5 reps  Wrist Extension AROM;Left;10 reps;Other (comment)  Digit Composite Flexion AROM;Left;AAROM;10 reps;Squeeze ball;Limitations  Composite Extension AROM;Left;10 reps;Other (comment);Limitations  Composite Extension Limitations extensor lag in 4th and 5th digits  Hand Exercises  Digit Composite Abduction Limitations  Digit Composite Adduction Limitations  Digit Lifts AROM;10 reps;Other (comment)  Thumb Abduction Left;5 reps  Thumb Adduction Left;5 reps  Opposition Left;5 reps  Digit Composite Abduction Limitations unable  Digit Composite Adduction Limitations unable  OT - End of Session  Equipment Utilized During Treatment Gait belt  Activity Tolerance Patient tolerated treatment well  Patient left Other (comment) (in w/c)  Nurse Communication Mobility status;Precautions;Weight bearing status  OT Assessment/Plan  Comments on Treatment Session Pt completing bed - w/c transfer with min A. Pt will be able to D/C tomorrow after therapy with 25/7 S of family.  OT Plan Discharge plan remains appropriate  OT Frequency Min 3X/week  Follow Up Recommendations Home health OT;Supervision/Assistance - 24 hour;Other (comment)  OT Equipment 3 in 1 bedside comode;Wheelchair (measurements OT);Wheelchair cushion (measurements OT);Tub/shower bench  Acute Rehab OT Goals  OT Goal  Formulation With patient  Time For Goal Achievement 01/23/12  Potential to Achieve Goals Good  ADL Goals  Pt Will Perform Eating Sitting, edge of  bed;with modified independence;with adaptive utensils  ADL Goal: Eating - Progress Met  Pt Will Perform Grooming with modified independence;with adaptive equipment;Sitting at sink  ADL Goal: Grooming - Progress Progressing toward goals  Pt Will Perform Upper Body Dressing with modified independence;Sitting, bed;Sitting, chair  ADL Goal: Upper Body Dressing - Progress Progressing toward goals  Pt Will Perform Lower Body Dressing with min assist;Sit to stand from bed;Sit to stand from chair  ADL Goal: Lower Body Dressing - Progress Progressing toward goals  Pt Will Transfer to Toilet with min assist;Stand pivot transfer;with DME  ADL Goal: Toilet Transfer - Progress Met  Pt Will Perform Toileting - Clothing Manipulation with supervision;Standing  ADL Goal: Toileting - Clothing Manipulation - Progress Progressing toward goals  Pt Will Perform Toileting - Hygiene Independently;Sitting on 3-in-1 or toilet  ADL Goal: Toileting - Hygiene - Progress Met  Additional ADL Goal #1 Pt will be Mod I with all bed mobility in prep for OOB functional activities.  ADL Goal: Additional Goal #1 - Progress Progressing toward goals  Arm Goals  Pt Will Perform AROM Left upper extremity;with supervision, verbal cues required/provided;to maintain range of motion;1 set;Other (comment)  Additional Arm Goal #1 Pt will keep LUE elevated on 3 pillow and verbalize understanding of edema management.  Arm Goal: Additional Goal #1 - Progress Met  Arm Goal: AROM - Progress Progressing toward goal  OT General Charges  $OT Visit 1 Procedure  OT Treatments  $Self Care/Home Management  8-22 mins  $Therapeutic Activity 8-22 mins  $Therapeutic Exercise 8-22 mins   [facility], OTR/L  [phone] 01/15/2012
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
## 2012-01-15 NOTE — Progress Notes (Signed)
Pt refuses any pain meds stated " No More narcotic meds for me,they are making me feel strange like i'm not in my body' also c/o feeling restless, hot/cold alternating feelings. Also last BM recorded 12/14 drank 1/2 bottle mag citrate on 12/21. Refused senokot or colace scheduled " They dont do any good anyway" he stated.Tylenol offered but refused.Hypo bowell sounds and poor appetite.C/o some mild numbness/tingling left hand cms+ 3+ l radial pulse.Also refused scheduled ambien at hs.Linward Headland D

## 2012-01-15 NOTE — Progress Notes (Signed)
Added goal to treatment plan  01/15/2012 Milana Kidney DPT PAGER: [phone] OFFICE: [phone]

## 2012-01-15 NOTE — Progress Notes (Signed)
Home monday

## 2012-01-15 NOTE — Progress Notes (Addendum)
OT Treatment Note   01/14/12 1551  OT Visit Information  Last OT Received On 01/14/12  Assistance Needed +2  OT Time Calculation  OT Start Time 1447  OT Stop Time 1514  OT Time Calculation (min) 27 min  Precautions  Precautions Fall  Required Braces or Orthoses Other Brace/Splint  Other Brace/Splint wound vac to left LE and sling for left UE  Restrictions  RUE Weight Bearing WBAT  LUE Weight Bearing NWB  LLE Weight Bearing NWB  Exercises  Exercises Hand exercises;General Upper Extremity  General Exercises - Upper Extremity  Shoulder Flexion AAROM;5 reps  Wrist Flexion AROM;Left;5 reps  Wrist Extension AROM;Left;10 reps;Other (comment) (place and hold)  Digit Composite Flexion AROM;Left;AAROM;10 reps;Squeeze ball;Limitations (unable to touch tips of fingers to palm)  Composite Extension AROM;Left;10 reps;Other (comment);Limitations  Composite Extension Limitations extensor lag 4th and 5th fingers  Hand Exercises  Digit Composite Abduction Limitations  Digit Composite Adduction Limitations  Digit Lifts AROM;10 reps;Other (comment) (extensor lag 4th and 5th digits)  Thumb Abduction Left;5 reps  Thumb Adduction Left;5 reps  Opposition Left;5 reps  Digit Composite Abduction Limitations unable to elicit  Digit Composite Adduction Limitations unable to elicit. compensating with flexors  OT - End of Session  Equipment Utilized During Treatment Gait belt  Activity Tolerance Patient tolerated treatment well  Patient left in chair;with call bell/phone within reach;with family/visitor present  Nurse Communication Mobility status;Precautions;Weight bearing status  OT Assessment/Plan  Comments on Treatment Session Focus of session on L hand assessment and rehab. Pt with apparent deficits in median, ulnar and radial distributions. Pt c/o numbness throughout entire hand, especially digits. Pt unable to elicit finger add/abduction, weakness in thumb abd/add and ext, weakness in extensor  indicis. Pt given squeeze ball and theraputty exercises. Will continue to follow acutely.  OT Plan Discharge plan remains appropriate  OT Frequency Min 3X/week  Follow Up Recommendations Home health OT;Supervision/Assistance - 24 hour;Other (comment)  OT Equipment 3 in 1 bedside comode;Wheelchair (measurements OT);Wheelchair cushion (measurements OT);Tub/shower bench  Acute Rehab OT Goals  OT Goal Formulation With patient  Time For Goal Achievement 01/23/12  Potential to Achieve Goals Good  ADL Goals  Pt Will Perform Eating Sitting, edge of bed;with modified independence;with adaptive utensils  ADL Goal: Eating - Progress Met  Pt Will Perform Grooming with modified independence;with adaptive equipment;Sitting at sink  ADL Goal: Grooming - Progress Progressing toward goals  Pt Will Perform Upper Body Dressing with modified independence;Sitting, bed;Sitting, chair  Pt Will Perform Lower Body Dressing with min assist;Sit to stand from bed;Sit to stand from chair  ADL Goal: Lower Body Dressing - Progress Progressing toward goals  Pt Will Transfer to Toilet with min assist;Stand pivot transfer;with DME  ADL Goal: Toilet Transfer - Progress Progressing toward goals  Pt Will Perform Toileting - Clothing Manipulation with supervision;Standing  ADL Goal: Toileting - Clothing Manipulation - Progress Progressing toward goals  Pt Will Perform Toileting - Hygiene Independently;Sitting on 3-in-1 or toilet  ADL Goal: Toileting - Hygiene - Progress Progressing toward goals  Additional ADL Goal #1 Pt will be Mod I with all bed mobility in prep for OOB functional activities.  ADL Goal: Additional Goal #1 - Progress Progressing toward goals  Arm Goals  Pt Will Perform AROM Left upper extremity;with supervision, verbal cues required/provided;to maintain range of motion;1 set;Other (comment)  Additional Arm Goal #1 Pt will keep LUE elevated on 3 pillow and verbalize understanding of edema management.  Arm  Goal: Additional Goal #1 - Progress  Met  Arm Goal: AROM - Progress Progressing toward goal  OT General Charges  $OT Visit 1 Procedure  OT Treatments  $Therapeutic Exercise 23-37 mins   late entry [facility], OTR/L  [phone] 01/14/2012

## 2012-01-15 NOTE — Progress Notes (Signed)
Physical Therapy Treatment Patient Details Name: Jacob Alvarado MRN: [id number] DOB: 08/03/1980 Today's Date: 01/15/2012 Time: 4098-1191 PT Time Calculation (min): 50 min  PT Assessment / Plan / Recommendation Comments on Treatment Session  Pt performed bed>w/c with min (A) today.  +2 for lines but overall pt did well just required increased time to complete.  Educated pt on w/c management/propulsion.  w/c goal added to POC    Follow Up Recommendations  Home health PT     Does the patient have the potential to tolerate intense rehabilitation     Barriers to Discharge        Equipment Recommendations  Wheelchair cushion (measurements PT);Wheelchair (measurements PT)    Recommendations for Other Services    Frequency Min 5X/week   Plan Discharge plan remains appropriate;Frequency remains appropriate    Precautions / Restrictions Precautions Precautions: Fall Required Braces or Orthoses: Other Brace/Splint Other Brace/Splint: wound vac to left LE and sling for left UE Restrictions Weight Bearing Restrictions: Yes RUE Weight Bearing: Weight bearing as tolerated LUE Weight Bearing: Non weight bearing LLE Weight Bearing: Non weight bearing   Pertinent Vitals/Pain Pt c/o pain in LLE & LUE.  Pt declines pain medication at this time.      Mobility  Bed Mobility Bed Mobility: Supine to Sit;Sitting - Scoot to Edge of Bed Supine to Sit: 4: Min assist;With rails Sitting - Scoot to [place] of Bed: 4: Min assist Details for Bed Mobility Assistance: Cues for sequencing & technique.  (A) for L LE management with minor (A) to bring shoulders/trunk to sitting upright.   Transfers Transfers: Sit to Stand;Stand to [employer] Transfers: 4: Min assist;With upper extremity assistance Details for Transfer Assistance: Pt performed squat pivot transfer from bed>w/c going to Rt side.  (A) for safety & balance.  Cues for technique & reinforcement of NWBing LLE.    Ambulation/Gait Ambulation/Gait Assistance: Not tested (comment) Wheelchair Mobility Wheelchair Mobility: Yes Wheelchair Assistance: 5: Supervision Wheelchair Assistance Details (indicate cue type and reason): Educated pt on how to propel & change directions with RUE + RLE.   Wheelchair Propulsion: Right upper extremity;Right lower extremity Distance: 220      PT Goals Acute Rehab PT Goals Time For Goal Achievement: 01/23/12 Potential to Achieve Goals: Fair Pt will go Supine/Side to Sit: with min assist PT Goal: Supine/Side to Sit - Progress: Progressing toward goal Pt will Sit at [facility] of Bed: with supervision;1-2 min PT Goal: Sit at Edge Of Bed - Progress: Progressing toward goal Pt will go Sit to Supine/Side: with min assist Pt will go Sit to Stand: with mod assist Pt will go Stand to Sit: with mod assist Pt will Transfer Bed to Chair/Chair to Bed: with mod assist PT Transfer Goal: Bed to Chair/Chair to Bed - Progress: Met Pt will Stand: with min assist;1 - 2 min Pt will Ambulate: 1 - 15 feet;with +2 total assist Pt will Propel Wheelchair: with modified independence;> 150 feet PT Goal: Propel Wheelchair - Progress: Goal set today  Visit Information  Last PT Received On: 01/15/12 Assistance Needed: +2    Subjective Data      Cognition  Overall Cognitive Status: Appears within functional limits for tasks assessed/performed Arousal/Alertness: Awake/alert Orientation Level: Appears intact for tasks assessed Behavior During Session: [facility] for tasks performed    Balance     End of Session PT - End of Session Equipment Utilized During Treatment: Gait belt Activity Tolerance: Patient limited by pain;Patient tolerated treatment well Patient left:  Other (comment);with family/visitor present (w/c )     Verdell Face, PTA [phone] 01/15/2012
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
## 2012-01-16 ENCOUNTER — Encounter (HOSPITAL_COMMUNITY): Payer: Self-pay | Admitting: Orthopedic Surgery

## 2012-01-16 MED ORDER — METHOCARBAMOL 500 MG PO TABS: 500.0000 mg | ORAL_TABLET | Freq: Three times a day (TID) | ORAL | Status: DC

## 2012-01-16 MED ORDER — TRAMADOL HCL 50 MG PO TABS: 50.0000 mg | ORAL_TABLET | Freq: Four times a day (QID) | ORAL | Status: DC | PRN
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
## 2012-01-16 NOTE — Progress Notes (Signed)
Occupational Therapy Treatment Patient Details Name: Jacob Alvarado MRN: [id number] DOB: Apr 28, 1980 Today's Date: 01/16/2012 Time: 1191-4782 OT Time Calculation (min): 25 min  OT Assessment / Plan / Recommendation Comments on Treatment Session Pt with numbness in Lt hand currently. Pt educated on AROM at digits and wrist.     Follow Up Recommendations  Home health OT    Barriers to Discharge       Equipment Recommendations  3 in 1 bedside comode;Wheelchair (measurements OT);Wheelchair cushion (measurements OT);Tub/shower bench    Recommendations for Other Services    Frequency Min 3X/week   Plan Discharge plan remains appropriate    Precautions / Restrictions Precautions Precautions: Fall Required Braces or Orthoses: Other Brace/Splint Other Brace/Splint: LT UE sling with pending don of [place]e brace by Dion Saucier Restrictions RUE Weight Bearing: Weight bearing as tolerated LUE Weight Bearing: Non weight bearing LLE Weight Bearing: Non weight bearing   Pertinent Vitals/Pain Facial grimace with sit<>supine only Tolerating pain well    ADL  Toilet Transfer: Min Pension scheme manager Method: Surveyor, minerals: Raised toilet seat with arms (or 3-in-1 over toilet) Transfers/Ambulation Related to ADLs: Pt completed sit<>stand min guard (A). pt pivot to the Right to sit on 3n1. pt required min v/c for Lt LE positioning for sit<>stand to prevent weight bearing. pt pivot back to bed min guard with increased time. ADL Comments: Pt completed bedside commode transfer. Educated on need for 3n1 and w/c for d/c. Pt will need LT LE elevated leg rest. Pt discussed with therapist desire to now go to CIR. Britta Mccreedy boyette was called and pt is no longer a candiate for CIR. Pt will d/c home. pt prefers to d/c to girlfriends house after discussion because it is a flat entry and wide spacious area for w/c.    OT Diagnosis:    OT Problem List:   OT Treatment  Interventions:     OT Goals Acute Rehab OT Goals OT Goal Formulation: With patient Time For Goal Achievement: 01/23/12 Potential to Achieve Goals: Good ADL Goals Pt Will Perform Eating: Sitting, edge of bed;with modified independence;with adaptive utensils ADL Goal: Eating - Progress: Progressing toward goals Pt Will Perform Grooming: with modified independence;with adaptive equipment;Sitting at sink ADL Goal: Grooming - Progress: Progressing toward goals Pt Will Perform Upper Body Dressing: with modified independence;Sitting, bed;Sitting, chair Pt Will Perform Lower Body Dressing: with min assist;Sit to stand from bed;Sit to stand from chair Pt Will Transfer to Toilet: with min assist;Stand pivot transfer;with DME ADL Goal: Toilet Transfer - Progress: Met Pt Will Perform Toileting - Clothing Manipulation: with supervision;Standing Pt Will Perform Toileting - Hygiene: Independently;Sitting on 3-in-1 or toilet Additional ADL Goal #1: Pt will be Mod I with all bed mobility in prep for OOB functional activities. ADL Goal: Additional Goal #1 - Progress: Progressing toward goals Arm Goals Pt Will Perform AROM: Left upper extremity;with supervision, verbal cues required/provided;to maintain range of motion;1 set;Other (comment) Additional Arm Goal #1: Pt will keep LUE elevated on 3 pillow and verbalize understanding of edema management. Arm Goal: Additional Goal #1 - Progress: Met  Visit Information  Last OT Received On: 01/16/12 Assistance Needed: +1    Subjective Data      Prior Functioning       Cognition  Overall Cognitive Status: Appears within functional limits for tasks assessed/performed Arousal/Alertness: Awake/alert Orientation Level: Appears intact for tasks assessed Behavior During Session: [facility] for tasks performed    Mobility  Shoulder Instructions Bed Mobility Supine to  Sit: 5: Supervision Sitting - Scoot to Edge of Bed: 5: Supervision Sit to Supine: 3: Mod  assist Details for Bed Mobility Assistance: pt requires (A) with LT LE for sit<>supine. pt educated on using Rt LE to (A) Lt Le however pt is not able to complete yet Transfers Sit to Stand: 4: Min guard;With upper extremity assist;From bed Stand to Sit: 4: Min guard;With upper extremity assist;To bed       Exercises  General Exercises - Upper Extremity Wrist Flexion: AROM;Left;5 reps Wrist Extension: AROM;Left;10 reps;Other (comment) Digit Composite Flexion: AROM;Left;AAROM;10 reps;Squeeze ball;Limitations Composite Extension: AROM;Left;10 reps;Other (comment);Limitations   Balance     End of Session OT - End of Session Activity Tolerance: Patient tolerated treatment well Patient left: in bed;with call bell/phone within reach Nurse Communication: Mobility status;Precautions;Weight bearing status  GO     Harrel Carina [facility] 01/16/2012, 11:56 AM Pager: [phone]
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
## 2012-01-16 NOTE — Progress Notes (Signed)
Orthopedic Tech Progress Note Patient Details:  Jacob Alvarado 05-11-1980 [id number]  Patient ID: [name], male   DOB: August 03, 1980, 31 y.o.   MRN: [id number]   [name] 01/16/2012, 1:50 PM bledsoe brace completed by [employer].
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
## 2012-01-16 NOTE — Progress Notes (Signed)
Orthopedic Tech Progress Note Patient Details:  Jacob Alvarado [birth year] [id number]  Patient ID: Jacob Alvarado, male   DOB: Sep 21, 1980, 30 y.o.   MRN: [id number]   [name] 01/16/2012, 8:57 AM CALLED [employer] FOR LEFT BLEDSOE BRACE.
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
## 2012-01-16 NOTE — Progress Notes (Signed)
[phone] is the best phone number to reach patient. The address in EPIC is correct.  HHRN will be from [facility] per pt choice and insurance coverage. [facility]'s main number given to pt's brother.

## 2012-01-16 NOTE — Progress Notes (Signed)
Contacted by Trauma PA. Patient wants to consider inpt rehab rather than d/c home with family today. Noted progress with PT and OT yesterday. Patient not in need of intense inpt rehab at this functional level. Recommend home with [facility] or SNF at this time. [phone]

## 2012-01-16 NOTE — Discharge Summary (Signed)
Patient clinically stable to go home.    This patient has been seen and I agree with the findings and treatment plan.  Marta Lamas. Gae Bon, MD, FACS [phone] (pager) [phone] (direct pager) Trauma Surgeon

## 2012-01-16 NOTE — Progress Notes (Signed)
Patient at a level to be able to d/c home with family support. [phone]

## 2012-01-16 NOTE — Progress Notes (Signed)
Physical Therapy Treatment Patient Details Name: Jacob Alvarado MRN: [id number] DOB: [birth year] Today's Date: 01/16/2012 Time: 4098-1191 PT Time Calculation (min): 25 min  PT Assessment / Plan / Recommendation Comments on Treatment Session  Patient progresing with bed mobilty and transfer. Somewhat nervous about discharging home. Will be staying with his girlfriend who has 3 steps to enter the house. Patient educated and given handout on steps with WC.     Follow Up Recommendations  Home health PT     Does the patient have the potential to tolerate intense rehabilitation     Barriers to Discharge        Equipment Recommendations  Wheelchair cushion (measurements PT);Wheelchair (measurements PT)    Recommendations for Other Services    Frequency Min 5X/week   Plan Discharge plan remains appropriate;Frequency remains appropriate    Precautions / Restrictions Precautions Precautions: Fall Required Braces or Orthoses: Other Brace/Splint Other Brace/Splint: LT UE sling with pending don of Bledsoe brace by Dion Saucier Restrictions RUE Weight Bearing: Weight bearing as tolerated LUE Weight Bearing: Non weight bearing LLE Weight Bearing: Non weight bearing   Pertinent Vitals/Pain     Mobility  Bed Mobility Supine to Sit: 5: Supervision Sitting - Scoot to Edge of Bed: 5: Supervision Sit to Supine: 3: Mod assist Details for Bed Mobility Assistance: pt requires (A) with LT LE for sit<>supine. pt educated on using Rt LE to (A) Lt Le however pt is not able to complete yet Transfers Sit to Stand: 4: Min guard;With upper extremity assist;From bed;From chair/3-in-1 Stand to Sit: 4: Min guard;With upper extremity assist;To bed;To chair/3-in-1 Squat Pivot Transfers: 4: Min guard;With upper extremity assistance Details for Transfer Assistance: Cues to maintain NWB on LLE with standing.  Ambulation/Gait Ambulation/Gait Assistance: Not tested (comment) Wheelchair  Mobility Wheelchair Mobility: No    Exercises Total Joint Exercises Heel Slides: AAROM;Strengthening;Left;5 reps General Exercises - Upper Extremity Wrist Flexion: AROM;Left;5 reps Wrist Extension: AROM;Left;10 reps;Other (comment) Digit Composite Flexion: AROM;Left;AAROM;10 reps;Squeeze ball;Limitations Composite Extension: AROM;Left;10 reps;Other (comment);Limitations   PT Diagnosis:    PT Problem List:   PT Treatment Interventions:     PT Goals Acute Rehab PT Goals PT Goal: Supine/Side to Sit - Progress: Progressing toward goal PT Goal: Sit at Edge Of Bed - Progress: Progressing toward goal PT Goal: Sit to Supine/Side - Progress: Progressing toward goal PT Goal: Sit to Stand - Progress: Progressing toward goal PT Goal: Stand to Sit - Progress: Progressing toward goal PT Transfer Goal: Bed to Chair/Chair to Bed - Progress: Met  Visit Information  Last PT Received On: 01/16/12 Assistance Needed: +1    Subjective Data      Cognition  Overall Cognitive Status: Appears within functional limits for tasks assessed/performed Arousal/Alertness: Awake/alert Orientation Level: Appears intact for tasks assessed Behavior During Session: [facility] for tasks performed    Balance     End of Session     GP     Fredrich Birks 01/16/2012, 2:13 PM

## 2012-01-23 NOTE — Progress Notes (Signed)
I saw and examined the patient with Mr. Paul, communicating the findings and plan noted above.  Noelene Gang, MD [facility] [phone] (p)  

## 2012-02-28 ENCOUNTER — Encounter (HOSPITAL_COMMUNITY): Payer: Self-pay | Admitting: *Deleted

## 2012-02-28 ENCOUNTER — Emergency Department (HOSPITAL_COMMUNITY): Payer: Medicaid Other

## 2012-02-28 ENCOUNTER — Emergency Department (HOSPITAL_COMMUNITY)
Admission: EM | Admit: 2012-02-28 | Discharge: 2012-02-28 | Disposition: A | Discharge status: Home / Self Care | Payer: Medicaid Other | Attending: Emergency Medicine | Admitting: Emergency Medicine

## 2012-02-28 DIAGNOSIS — Y9289 Other specified places as the place of occurrence of the external cause: Secondary | ICD-10-CM | POA: Insufficient documentation

## 2012-02-28 DIAGNOSIS — S8990XA Unspecified injury of unspecified lower leg, initial encounter: Secondary | ICD-10-CM | POA: Insufficient documentation

## 2012-02-28 DIAGNOSIS — Y939 Activity, unspecified: Secondary | ICD-10-CM | POA: Insufficient documentation

## 2012-02-28 DIAGNOSIS — Z87891 Personal history of nicotine dependence: Secondary | ICD-10-CM | POA: Insufficient documentation

## 2012-02-28 DIAGNOSIS — Z87828 Personal history of other (healed) physical injury and trauma: Secondary | ICD-10-CM | POA: Insufficient documentation

## 2012-02-28 DIAGNOSIS — M25562 Pain in left knee: Secondary | ICD-10-CM

## 2012-02-28 DIAGNOSIS — Z8781 Personal history of (healed) traumatic fracture: Secondary | ICD-10-CM | POA: Insufficient documentation

## 2012-02-28 DIAGNOSIS — S99929A Unspecified injury of unspecified foot, initial encounter: Secondary | ICD-10-CM | POA: Insufficient documentation

## 2012-02-28 DIAGNOSIS — I10 Essential (primary) hypertension: Secondary | ICD-10-CM | POA: Insufficient documentation

## 2012-02-28 MED ORDER — TRAMADOL HCL 50 MG PO TABS
50.0000 mg | ORAL_TABLET | Freq: Once | ORAL | Status: AC
  Administered 2012-02-28: 50 mg via ORAL
  Filled 2012-02-28: qty 1

## 2012-02-28 MED ORDER — TRAMADOL HCL 50 MG PO TABS: 50.0000 mg | ORAL_TABLET | Freq: Four times a day (QID) | ORAL | Status: DC | PRN

## 2012-02-28 NOTE — ED Provider Notes (Signed)
Medical screening examination/treatment/procedure(s) were performed by non-physician practitioner and as supervising physician I was immediately available for consultation/collaboration.   Gerhard Munch, MD 02/28/12 [phone]
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
## 2012-04-09 ENCOUNTER — Telehealth: Payer: Self-pay

## 2012-04-16 ENCOUNTER — Ambulatory Visit: Payer: Medicaid Other | Attending: Orthopedic Surgery | Admitting: Physical Therapy

## 2012-04-16 DIAGNOSIS — M6281 Muscle weakness (generalized): Secondary | ICD-10-CM | POA: Insufficient documentation

## 2012-04-16 DIAGNOSIS — R262 Difficulty in walking, not elsewhere classified: Secondary | ICD-10-CM | POA: Insufficient documentation

## 2012-04-16 DIAGNOSIS — M25519 Pain in unspecified shoulder: Secondary | ICD-10-CM | POA: Insufficient documentation

## 2012-04-16 DIAGNOSIS — M79609 Pain in unspecified limb: Secondary | ICD-10-CM | POA: Insufficient documentation

## 2012-04-16 DIAGNOSIS — IMO0001 Reserved for inherently not codable concepts without codable children: Secondary | ICD-10-CM | POA: Insufficient documentation

## 2012-04-16 DIAGNOSIS — M25569 Pain in unspecified knee: Secondary | ICD-10-CM | POA: Insufficient documentation

## 2012-04-26 ENCOUNTER — Ambulatory Visit: Payer: Medicaid Other | Attending: Orthopedic Surgery | Admitting: Physical Therapy

## 2012-04-26 DIAGNOSIS — R262 Difficulty in walking, not elsewhere classified: Secondary | ICD-10-CM | POA: Insufficient documentation

## 2012-04-26 DIAGNOSIS — IMO0001 Reserved for inherently not codable concepts without codable children: Secondary | ICD-10-CM | POA: Insufficient documentation

## 2012-04-26 DIAGNOSIS — M25569 Pain in unspecified knee: Secondary | ICD-10-CM | POA: Insufficient documentation

## 2012-04-26 DIAGNOSIS — M25519 Pain in unspecified shoulder: Secondary | ICD-10-CM | POA: Insufficient documentation

## 2012-04-26 DIAGNOSIS — M6281 Muscle weakness (generalized): Secondary | ICD-10-CM | POA: Insufficient documentation

## 2012-04-26 DIAGNOSIS — M79609 Pain in unspecified limb: Secondary | ICD-10-CM | POA: Insufficient documentation

## 2012-05-03 ENCOUNTER — Ambulatory Visit: Payer: Medicaid Other | Admitting: Physical Therapy

## 2012-05-15 ENCOUNTER — Encounter: Payer: Medicaid Other | Admitting: Physical Therapy

## 2012-05-17 ENCOUNTER — Ambulatory Visit: Payer: Medicaid Other | Admitting: Physical Therapy

## 2013-04-22 ENCOUNTER — Encounter (HOSPITAL_COMMUNITY): Payer: Self-pay | Admitting: Emergency Medicine

## 2013-04-22 ENCOUNTER — Emergency Department (INDEPENDENT_AMBULATORY_CARE_PROVIDER_SITE_OTHER)
Admission: EM | Admit: 2013-04-22 | Discharge: 2013-04-22 | Disposition: A | Discharge status: Home / Self Care | Payer: Self-pay | Source: Home / Self Care | Attending: Emergency Medicine | Admitting: Emergency Medicine

## 2013-04-22 DIAGNOSIS — R599 Enlarged lymph nodes, unspecified: Secondary | ICD-10-CM

## 2013-04-22 DIAGNOSIS — R591 Generalized enlarged lymph nodes: Secondary | ICD-10-CM

## 2013-04-22 LAB — POCT RAPID STREP A: STREPTOCOCCUS, GROUP A SCREEN (DIRECT): NEGATIVE

## 2013-04-22 MED ORDER — MINOCYCLINE HCL 100 MG PO CAPS: 100.0000 mg | ORAL_CAPSULE | Freq: Two times a day (BID) | ORAL | Status: DC

## 2013-04-22 MED ORDER — NAPROXEN 500 MG PO TABS: 500.0000 mg | ORAL_TABLET | Freq: Two times a day (BID) | ORAL | Status: DC

## 2013-04-22 NOTE — Discharge Instructions (Signed)
Treat this at home with the prescription naproxen twice daily for 2-3 days, if your symptoms are getting worse or not getting any better you may then fill and start the antibiotic minocycline.  Lymphadenopathy Lymphadenopathy means "disease of the lymph glands." But the term is usually used to describe swollen or enlarged lymph glands, also called lymph nodes. These are the bean-shaped organs found in many locations including the neck, underarm, and groin. Lymph glands are part of the immune system, which fights infections in your body. Lymphadenopathy can occur in just one area of the body, such as the neck, or it can be generalized, with lymph node enlargement in several areas. The nodes found in the neck are the most common sites of lymphadenopathy. CAUSES  When your immune system responds to germs (such as viruses or bacteria ), infection-fighting cells and fluid build up. This causes the glands to grow in size. This is usually not something to worry about. Sometimes, the glands themselves can become infected and inflamed. This is called lymphadenitis. Enlarged lymph nodes can be caused by many diseases:  Bacterial disease, such as strep throat or a skin infection.  Viral disease, such as a common cold.  Other germs, such as lyme disease, tuberculosis, or sexually transmitted diseases.  Cancers, such as lymphoma (cancer of the lymphatic system) or leukemia (cancer of the white blood cells).  Inflammatory diseases such as lupus or rheumatoid arthritis.  Reactions to medications. Many of the diseases above are rare, but important. This is why you should see your caregiver if you have lymphadenopathy. SYMPTOMS   Swollen, enlarged lumps in the neck, back of the head or other locations.  Tenderness.  Warmth or redness of the skin over the lymph nodes.  Fever. DIAGNOSIS  Enlarged lymph nodes are often near the source of infection. They can help healthcare providers diagnose your illness.  For instance:   Swollen lymph nodes around the jaw might be caused by an infection in the mouth.  Enlarged glands in the neck often signal a throat infection.  Lymph nodes that are swollen in more than one area often indicate an illness caused by a virus. Your caregiver most likely will know what is causing your lymphadenopathy after listening to your history and examining you. Blood tests, x-rays or other tests may be needed. If the cause of the enlarged lymph node cannot be found, and it does not go away by itself, then a biopsy may be needed. Your caregiver will discuss this with you. TREATMENT  Treatment for your enlarged lymph nodes will depend on the cause. Many times the nodes will shrink to normal size by themselves, with no treatment. Antibiotics or other medicines may be needed for infection. Only take over-the-counter or prescription medicines for pain, discomfort or fever as directed by your caregiver. HOME CARE INSTRUCTIONS  Swollen lymph glands usually return to normal when the underlying medical condition goes away. If they persist, contact your health-care provider. He/she might prescribe antibiotics or other treatments, depending on the diagnosis. Take any medications exactly as prescribed. Keep any follow-up appointments made to check on the condition of your enlarged nodes.  SEEK MEDICAL CARE IF:   Swelling lasts for more than two weeks.  You have symptoms such as weight loss, night sweats, fatigue or fever that does not go away.  The lymph nodes are hard, seem fixed to the skin or are growing rapidly.  Skin over the lymph nodes is red and inflamed. This could mean there is  an infection. SEEK IMMEDIATE MEDICAL CARE IF:   Fluid starts leaking from the area of the enlarged lymph node.  You develop a fever of 102 F (38.9 C) or greater.  Severe pain develops (not necessarily at the site of a large lymph node).  You develop chest pain or shortness of breath.  You  develop worsening abdominal pain. MAKE SURE YOU:   Understand these instructions.  Will watch your condition.  Will get help right away if you are not doing well or get worse. Document Released: 10/20/2007 Document Revised: 04/04/2011 Document Reviewed: 10/20/2007 [facility] Patient Information 2014 [place], [place].  Antibiotic Nonuse  Your caregiver felt that the infection or problem was not one that would be helped with an antibiotic. Infections may be caused by viruses or bacteria. Only a caregiver can tell which one of these is the likely cause of an illness. A cold is the most common cause of infection in both adults and children. A cold is a virus. Antibiotic treatment will have no effect on a viral infection. Viruses can lead to many lost days of work caring for sick children and many missed days of school. Children may catch as many as 10 "colds" or "flus" per year during which they can be tearful, cranky, and uncomfortable. The goal of treating a virus is aimed at keeping the ill person comfortable. Antibiotics are medications used to help the body fight bacterial infections. There are relatively few types of bacteria that cause infections but there are hundreds of viruses. While both viruses and bacteria cause infection they are very different types of germs. A viral infection will typically go away by itself within 7 to 10 days. Bacterial infections may spread or get worse without antibiotic treatment. Examples of bacterial infections are:  Sore throats (like strep throat or tonsillitis).  Infection in the lung (pneumonia).  Ear and skin infections. Examples of viral infections are:  Colds or flus.  Most coughs and bronchitis.  Sore throats not caused by Strep.  Runny noses. It is often best not to take an antibiotic when a viral infection is the cause of the problem. Antibiotics can kill off the helpful bacteria that we have inside our body and allow harmful bacteria to start  growing. Antibiotics can cause side effects such as allergies, nausea, and diarrhea without helping to improve the symptoms of the viral infection. Additionally, repeated uses of antibiotics can cause bacteria inside of our body to become resistant. That resistance can be passed onto harmful bacterial. The next time you have an infection it may be harder to treat if antibiotics are used when they are not needed. Not treating with antibiotics allows our own immune system to develop and take care of infections more efficiently. Also, antibiotics will work better for us when they are prescribed for bacterial infections. Treatments for a child that is ill may include:  Give extra fluids throughout the day to stay hydrated.  Get plenty of rest.  Only give your child over-the-counter or prescription medicines for pain, discomfort, or fever as directed by your caregiver.  The use of a cool mist humidifier may help stuffy noses.  Cold medications if suggested by your caregiver. Your caregiver may decide to start you on an antibiotic if:  The problem you were seen for today continues for a longer length of time than expected.  You develop a secondary bacterial infection. SEEK MEDICAL CARE IF:  Fever lasts longer than 5 days.  Symptoms continue to get worse  after 5 to 7 days or become severe.  Difficulty in breathing develops.  Signs of dehydration develop (poor drinking, rare urinating, dark colored urine).  Changes in behavior or worsening tiredness (listlessness or lethargy). Document Released: 03/21/2001 Document Revised: 04/04/2011 Document Reviewed: 09/17/2008 [employer] Patient Information 2014 [place], [place].

## 2013-04-22 NOTE — ED Notes (Addendum)
Pt c/o swollen lymph nodes/throat onset 3 days associated w/dizziness Denies f/v/n/d, cold sxs Also c/o HTN; BP today is 145/89 Alert w/no signs of acute distress.
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
## 2013-04-26 LAB — CULTURE, GROUP A STREP

## 2013-08-13 ENCOUNTER — Emergency Department (HOSPITAL_COMMUNITY): Payer: No Typology Code available for payment source

## 2013-08-13 ENCOUNTER — Encounter (HOSPITAL_COMMUNITY): Payer: Self-pay | Admitting: Emergency Medicine

## 2013-08-13 ENCOUNTER — Emergency Department (HOSPITAL_COMMUNITY)
Admission: EM | Admit: 2013-08-13 | Discharge: 2013-08-13 | Disposition: A | Discharge status: Home / Self Care | Payer: No Typology Code available for payment source | Attending: Emergency Medicine | Admitting: Emergency Medicine

## 2013-08-13 DIAGNOSIS — Y92410 Unspecified street and highway as the place of occurrence of the external cause: Secondary | ICD-10-CM | POA: Insufficient documentation

## 2013-08-13 DIAGNOSIS — Z792 Long term (current) use of antibiotics: Secondary | ICD-10-CM | POA: Insufficient documentation

## 2013-08-13 DIAGNOSIS — Z8781 Personal history of (healed) traumatic fracture: Secondary | ICD-10-CM | POA: Insufficient documentation

## 2013-08-13 DIAGNOSIS — Y9389 Activity, other specified: Secondary | ICD-10-CM | POA: Insufficient documentation

## 2013-08-13 DIAGNOSIS — Z87891 Personal history of nicotine dependence: Secondary | ICD-10-CM | POA: Insufficient documentation

## 2013-08-13 DIAGNOSIS — Z9889 Other specified postprocedural states: Secondary | ICD-10-CM

## 2013-08-13 DIAGNOSIS — Z791 Long term (current) use of non-steroidal anti-inflammatories (NSAID): Secondary | ICD-10-CM | POA: Insufficient documentation

## 2013-08-13 DIAGNOSIS — Z87828 Personal history of other (healed) physical injury and trauma: Secondary | ICD-10-CM | POA: Insufficient documentation

## 2013-08-13 DIAGNOSIS — S8012XA Contusion of left lower leg, initial encounter: Secondary | ICD-10-CM

## 2013-08-13 DIAGNOSIS — I10 Essential (primary) hypertension: Secondary | ICD-10-CM | POA: Insufficient documentation

## 2013-08-13 DIAGNOSIS — S8010XA Contusion of unspecified lower leg, initial encounter: Secondary | ICD-10-CM | POA: Insufficient documentation

## 2013-08-13 MED ORDER — IBUPROFEN 400 MG PO TABS
800.0000 mg | ORAL_TABLET | Freq: Once | ORAL | Status: AC
  Administered 2013-08-13: 800 mg via ORAL
  Filled 2013-08-13: qty 2

## 2013-08-13 MED ORDER — OXYCODONE-ACETAMINOPHEN 5-325 MG PO TABS: 1.0000 | ORAL_TABLET | ORAL | Status: DC | PRN

## 2013-08-13 MED ORDER — OXYCODONE-ACETAMINOPHEN 5-325 MG PO TABS
1.0000 | ORAL_TABLET | Freq: Once | ORAL | Status: AC
  Administered 2013-08-13: 1 via ORAL
  Filled 2013-08-13: qty 1

## 2013-08-13 MED ORDER — IBUPROFEN 800 MG PO TABS: 800.0000 mg | ORAL_TABLET | Freq: Three times a day (TID) | ORAL | Status: DC
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
## 2013-08-13 NOTE — Discharge Instructions (Signed)
Call Dr. Carola FrostHandy, he is expecting you in clinic tomorrow. Call for a follow up appointment with a Family or Primary Care Provider.  Return if Symptoms worsen.   Take medication as prescribed.  Ice your leg 3-4 times a day. Elevate your foot when you are not walking or standing.  Do not operate heavy machinery or drink alcohol while you are taking Norco (hydrocodone).   Emergency Department Resource Guide 1) Find a Doctor and Pay Out of Pocket Although you won't have to find out who is covered by your insurance plan, it is a good idea to ask around and get recommendations. You will then need to call the office and see if the doctor you have chosen will accept you as a new patient and what types of options they offer for patients who are self-pay. Some doctors offer discounts or will set up payment plans for their patients who do not have insurance, but you will need to ask so you aren't surprised when you get to your appointment.  2) Contact Your Local Health Department Not all health departments have doctors that can see patients for sick visits, but many do, so it is worth a call to see if yours does. If you don't know where your local health department is, you can check in your phone book. The CDC also has a tool to help you locate your state's health department, and many state websites also have listings of all of their local health departments.  3) Find a Walk-in Clinic If your illness is not likely to be very severe or complicated, you may want to try a walk in clinic. These are popping up all over the country in pharmacies, drugstores, and shopping centers. They're usually staffed by nurse practitioners or physician assistants that have been trained to treat common illnesses and complaints. They're usually fairly quick and inexpensive. However, if you have serious medical issues or chronic medical problems, these are probably not your best option.  No Primary Care Doctor: - Call Health  Connect at  [phone] - they can help you locate a primary care doctor that  accepts your insurance, provides certain services, etc. - Physician Referral Service- [phone]  Chronic Pain Problems: Organization         Address  Phone   Notes  [facility]  [phone] Patients need to be referred by their primary care doctor.   Medication Assistance: Organization         Address  Phone   Notes  [facility] [address] [place], [place] [id number] [phone]) [phone] --Must be a resident of [facility] [place] -- Must have NO insurance coverage whatsoever (no Medicaid/ Medicare, etc.) -- The pt. MUST have a primary care doctor that directs their care regularly and follows them in the community   MedAssist  [phone]   [employer]  [phone]    Agencies that provide inexpensive medical care: Organization         Address  Phone   Notes  [facility]  [phone]   [facility]    [phone]) [phone]   [facility] [address] [place], [place] [address] [phone]) [phone]   [facility] [address], [place] [phone]   [facility]anned [facility]    [phone]   [facility]    [phone]   [facility]  [phone]  [address], [place] Phone:  [phone], Fax:  [phone] Hours of Operation:  9 am - 6 pm, M-F.  Also accepts Medicaid/Medicare and self-pay.  [facility] for [place] [address], [address], [place] Phone: [phone], Fax: [phone]. Hours of Operation:  8:30 am - 5:30 pm, M-F.  Also accepts Medicaid and self-pay.  [facility] [place] [address] [place] Phone: [phone]   [place], [place], [place] [phone], Ext. [phone] Mondays & Thursdays: 7-9 AM.  First 15 patients are seen on a first come, first serve basis.     [place] Providers:  Organization         Address  Phone   Notes  [facility] [address], [place] [phone] Also accepts self-pay patients.  [facility] [address], [place]  [phone]   [place], [address], [place] [phone]   [facility] [address], [place] [phone]   Lucianne Lei [address], [place]   [phone] Only accepts [place] Access [place] patients after they have their name applied to their card.   Self-Pay (no insurance) in [facility]:  Organization         Address  Phone   Notes  Sickle Cell Patients, [facility] [place] [phone]   [facility] [place] [phone]   [facility] [place]  [place], [place],  [phone]   [facility]/Dr. Osei-Bonsu  [address], [place] or [address], [place] [phone] Phone number for both [place] and [place] locations is the same.  Urgent Medical and [facility] [address] [place] [phone]   [facility] [address] [place] or [address] [phone]   [facility] [address] [place] [phone], phone; [phone], fax Sees patients 1st and 3rd Saturday of every month.  Must not qualify for public or private insurance (i.e. Medicaid, Medicare, [place] Health Choice, Veterans' Benefits)  Household income should be no more than 200% of the poverty level The clinic cannot treat you if you are pregnant or think you are pregnant  Sexually transmitted diseases are not treated at the clinic.    Dental Care: Organization         Address  Phone  Notes  [facility]  Department of [facility] [place] [phone] Accepts children up to age 49 who are enrolled in [place] or [place]; pregnant women with a Medicaid card; and children who have applied for Medicaid or [place] Health Choice, but were declined, whose parents can pay a reduced fee at time of service.  [facility] Department of [facility]  [address], [place] [phone] Accepts children up to age 71 who are enrolled in [place] or [place]; pregnant women with a Medicaid card; and children who have applied for Medicaid or [place] Health Choice, but were declined, whose parents can pay a reduced fee at time of service.  [facility] Adult Dental Access PROGRAM  [place], [place] [phone] Patients  are seen by appointment only. Walk-ins are not accepted. [place] will see patients 23 years of age and older. Monday - Tuesday (8am-5pm) Most Wednesdays (8:30-5pm) $30 per visit, cash only  [facility] Adult Dental Access PROGRAM  [address], [facility] [phone] Patients are seen by appointment only. Walk-ins are not accepted. [place] will see patients 32 years of age and older. One Wednesday Evening (Monthly: Volunteer Based).  $30 per visit, cash only  [place]  [phone] for adults; Children under age 44, call Graduate Pediatric Dentistry at [phone]. Children aged 91-14, please call [phone] to request a pediatric application.  Dental services are provided in all areas of dental care including fillings, crowns and bridges, complete and partial dentures, implants, gum treatment, root canals, and extractions. Preventive care is also provided. Treatment is provided to both adults and children. Patients are selected via a lottery and there is often a waiting list.   [facility] [address], [place]  [phone]  [email or web address]   [facility] [address] [place], [place] [phone], Ext. [phone] Second and Fourth Thursday of each month, opens at 6:30 AM; Clinic ends at 9 AM.  Patients are seen on a first-come first-served basis, and a limited number are seen during each clinic.   [facility]  [address] Hillard Danker [place], [place] [phone]   Eligibility Requirements You must have lived in [place], [place], or [place] counties for at least the last three months.   You cannot be eligible for state or federal sponsored [employer], including [employer], [place], or [employer].   You generally cannot be eligible for healthcare insurance through your employer.    How to apply: Eligibility screenings are held every Tuesday and Wednesday afternoon from 1:00 pm until 4:00 pm. You do not need an appointment for the interview!  [facility] [address], [place], [place]   [place]  [place] Department  [place]  [phone]    Behavioral Health Resources in the Community: Intensive Outpatient Programs Organization         Address  Phone  Notes  [place] [facility]. [address] [place] [address], [place], [place] [phone]   [facility] [address], [place], [place]   ADS: [facility] Drug [facility]cs [address], [place], [place]   [place] [address],  [place], [place] or [phone]   Substance Abuse Resources Organization         Address  Phone  Notes  Alcohol [facility] Drug Services  [phone]   [place]  [phone]   The [place]   Chinita Pester  [phone]   Residential & Outpatient Substance Abuse Program  [phone]   Psychological Services Organization          Address  Phone  Notes  [facility]  [place]  [phone]   [place] [address] [place] or [phone]    Mobile Crisis Teams Organization         Address  Phone  Notes  [facility], Mobile Crisis Care Unit  [phone]   [facility]  [address] [place], [place]   [facility] [address] [place] 18  [place] [phone]    Self-Help/Support Groups Organization         Address  Phone             Notes  [facility] [place] - variety of support groups  [phone]- I7437963 Call for more information  Narcotics Anonymous (NA), [facility] [address], [employer] [place]  2 meetings at this location   Statistician         Address  Phone  Notes  [facility] [address],    [place]  [phone]   [facility]  [address], [place] [address], [place], [place] [phone]   [facility] [address] [place], [place] [phone] Admissions: 8am-3pm M-F  [facility] [address]    [place], [place] [phone]   [facility] [address] [place], [place], [place] [phone]   [facility] [address]  [place], [place] [phone]   [facility] - Intensive Outpatient [address], [place], [place] [phone]   [facility] (Addiction Recovery Care [facility] [address] [place].,  [place], [place] [phone] or [phone]   [facility] (RTS) [address], [place], [place] [phone] Accepts Medicaid  [facility] [place] [address]  [place] [phone] Substance Abuse/Addiction Treatment   [facility] Organization         Address  Phone  Notes  [facility]  [phone]   Angie Fava, PhD [address] Ervin Knack [place], [place]   [phone] or [phone]   [facility] Behavioral   [address] [place], [place] [phone]   [facility] [address] [place], [place] [phone] Insurance/Medicaid/sponsorship through [facility] and Families [address]                                    [place], [place] [phone] Therapy/tele-psych/case  [facility] [address], [place] [phone]    Dr. Lolly Mustache  [phone]   [facility] [place]  [employer] [facility] Dept. 1) [address] [place] 2) [address], [place] 3)  [address], [place] [phone]  [phone]   [facility] Child Abuse Hotline 314-771-5605 or [phone] (After Hours)
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
## 2013-08-13 NOTE — Discharge Planning (Signed)
[facility] Community Liaison  Spoke to patient regarding primary care resources and establishing care with a provider. Patient states he "wants to pay for his bill now". I informed pt of how to go about paying for today's visit. Resource guide and my contact information was provided for any future questions or concerns. No other Community Liaison needs identified at this time.

## 2013-08-14 NOTE — ED Provider Notes (Signed)
Medical screening examination/treatment/procedure(s) were performed by non-physician practitioner and as supervising physician I was immediately available for consultation/collaboration.   EKG Interpretation None      Veniamin Kincaid, MD, FACEP   Malaika Arnall L Laverne Hursey, MD 08/14/13 1615 

## 2014-02-27 ENCOUNTER — Emergency Department (INDEPENDENT_AMBULATORY_CARE_PROVIDER_SITE_OTHER)
Admission: EM | Admit: 2014-02-27 | Discharge: 2014-02-27 | Disposition: A | Discharge status: Home / Self Care | Payer: Self-pay | Source: Home / Self Care | Attending: Emergency Medicine | Admitting: Emergency Medicine

## 2014-02-27 ENCOUNTER — Encounter (HOSPITAL_COMMUNITY): Payer: Self-pay | Admitting: Emergency Medicine

## 2014-02-27 DIAGNOSIS — R141 Gas pain: Secondary | ICD-10-CM

## 2014-02-27 DIAGNOSIS — IMO0001 Reserved for inherently not codable concepts without codable children: Secondary | ICD-10-CM

## 2014-02-27 DIAGNOSIS — R0789 Other chest pain: Secondary | ICD-10-CM

## 2014-02-27 DIAGNOSIS — H8390 Unspecified disease of inner ear, unspecified ear: Secondary | ICD-10-CM

## 2014-02-27 DIAGNOSIS — R42 Dizziness and giddiness: Secondary | ICD-10-CM

## 2014-02-27 DIAGNOSIS — R030 Elevated blood-pressure reading, without diagnosis of hypertension: Secondary | ICD-10-CM

## 2014-02-27 MED ORDER — MECLIZINE HCL 25 MG PO TABS: ORAL_TABLET | ORAL | Status: DC

## 2014-02-27 NOTE — Discharge Instructions (Signed)
Abdominal Pain Many things can cause abdominal pain. Usually, abdominal pain is not caused by a disease and will improve without treatment. It can often be observed and treated at home. Your health care provider will do a physical exam and possibly order blood tests and X-rays to help determine the seriousness of your pain. However, in many cases, more time must pass before a clear cause of the pain can be found. Before that point, your health care provider may not know if you need more testing or further treatment. HOME CARE INSTRUCTIONS  Monitor your abdominal pain for any changes. The following actions may help to alleviate any discomfort you are experiencing:  Only take over-the-counter or prescription medicines as directed by your health care provider.  Do not take laxatives unless directed to do so by your health care provider.  Try a clear liquid diet (broth, tea, or water) as directed by your health care provider. Slowly move to a bland diet as tolerated. SEEK MEDICAL CARE IF:  You have unexplained abdominal pain.  You have abdominal pain associated with nausea or diarrhea.  You have pain when you urinate or have a bowel movement.  You experience abdominal pain that wakes you in the night.  You have abdominal pain that is worsened or improved by eating food.  You have abdominal pain that is worsened with eating fatty foods.  You have a fever. SEEK IMMEDIATE MEDICAL CARE IF:   Your pain does not go away within 2 hours.  You keep throwing up (vomiting).  Your pain is felt only in portions of the abdomen, such as the right side or the left lower portion of the abdomen.  You pass bloody or black tarry stools. MAKE SURE YOU:  Understand these instructions.   Will watch your condition.   Will get help right away if you are not doing well or get worse.  Document Released: 10/20/2004 Document Revised: 01/15/2013 Document Reviewed: 09/19/2012 [facility] Patient Information  2015 [place], [place]. This information is not intended to replace advice given to you by your health care provider. Make sure you discuss any questions you have with your health care provider.  Costochondritis Costochondritis, sometimes called Tietze syndrome, is a swelling and irritation (inflammation) of the tissue (cartilage) that connects your ribs with your breastbone (sternum). It causes pain in the chest and rib area. Costochondritis usually goes away on its own over time. It can take up to 6 weeks or longer to get better, especially if you are unable to limit your activities. CAUSES  Some cases of costochondritis have no known cause. Possible causes include:  Injury (trauma).  Exercise or activity such as lifting.  Severe coughing. SIGNS AND SYMPTOMS  Pain and tenderness in the chest and rib area.  Pain that gets worse when coughing or taking deep breaths.  Pain that gets worse with specific movements. DIAGNOSIS  Your health care provider will do a physical exam and ask about your symptoms. Chest X-rays or other tests may be done to rule out other problems. TREATMENT  Costochondritis usually goes away on its own over time. Your health care provider may prescribe medicine to help relieve pain. HOME CARE INSTRUCTIONS   Avoid exhausting physical activity. Try not to strain your ribs during normal activity. This would include any activities using chest, abdominal, and side muscles, especially if heavy weights are used.  Apply ice to the affected area for the first 2 days after the pain begins.  Put ice in a plastic  bag.  Place a towel between your skin and the bag.  Leave the ice on for 20 minutes, 2-3 times a day.  Only take over-the-counter or prescription medicines as directed by your health care provider. SEEK MEDICAL CARE IF:  You have redness or swelling at the rib joints. These are signs of infection.  Your pain does not go away despite rest or medicine. SEEK  IMMEDIATE MEDICAL CARE IF:   Your pain increases or you are very uncomfortable.  You have shortness of breath or difficulty breathing.  You cough up blood.  You have worse chest pains, sweating, or vomiting.  You have a fever or persistent symptoms for more than 2-3 days.  You have a fever and your symptoms suddenly get worse. MAKE SURE YOU:   Understand these instructions.  Will watch your condition.  Will get help right away if you are not doing well or get worse. Document Released: 10/20/2004 Document Revised: 10/31/2012 Document Reviewed: 08/14/2012 [facility] Patient Information 2015 [place]. This information is not intended to replace advice given to you by your health care provider. Make sure you discuss any questions you have with your health care provider.  Chest Wall Pain Chest wall pain is pain felt in or around the chest bones and muscles. It may take up to 6 weeks to get better. It may take longer if you are active. Chest wall pain can happen on its own. Other times, things like germs, injury, coughing, or exercise can cause the pain. HOME CARE   Avoid activities that make you tired or cause pain. Try not to use your chest, belly (abdominal), or side muscles. Do not use heavy weights.  Put ice on the sore area.  Put ice in a plastic bag.  Place a towel between your skin and the bag.  Leave the ice on for 15-20 minutes for the first 2 days.  Only take medicine as told by your doctor. GET HELP RIGHT AWAY IF:   You have more pain or are very uncomfortable.  You have a fever.  Your chest pain gets worse.  You have new problems.  You feel sick to your stomach (nauseous) or throw up (vomit).  You start to sweat or feel lightheaded.  You have a cough with mucus (phlegm).  You cough up blood. MAKE SURE YOU:   Understand these instructions.  Will watch your condition.  Will get help right away if you are not doing well or get worse. Document  Released: 06/29/2007 Document Revised: 04/04/2011 Document Reviewed: 09/06/2010 [facility] Patient Information 2015 [place], [place]. This information is not intended to replace advice given to you by your health care provider. Make sure you discuss any questions you have with your health care provider.  Dizziness Dizziness is a common problem. It is a feeling of unsteadiness or light-headedness. You may feel like you are about to faint. Dizziness can lead to injury if you stumble or fall. A person of any age group can suffer from dizziness, but dizziness is more common in older adults. CAUSES  Dizziness can be caused by many different things, including:  Middle ear problems.  Standing for too long.  Infections.  An allergic reaction.  Aging.  An emotional response to something, such as the sight of blood.  Side effects of medicines.  Tiredness.  Problems with circulation or blood pressure.  Excessive use of alcohol or medicines, or illegal drug use.  Breathing too fast (hyperventilation).  An irregular heart rhythm (arrhythmia).  A low red blood cell count (anemia).  Pregnancy.  Vomiting, diarrhea, fever, or other illnesses that cause body fluid loss (dehydration).  Diseases or conditions such as Parkinson's disease, high blood pressure (hypertension), diabetes, and thyroid problems.  Exposure to extreme heat. DIAGNOSIS  Your health care provider will ask about your symptoms, perform a physical exam, and perform an electrocardiogram (ECG) to record the electrical activity of your heart. Your health care provider may also perform other heart or blood tests to determine the cause of your dizziness. These may include:  Transthoracic echocardiogram (TTE). During echocardiography, sound waves are used to evaluate how blood flows through your heart.  Transesophageal echocardiogram (TEE).  Cardiac monitoring. This allows your health care provider to monitor your heart rate and  rhythm in real time.  Holter monitor. This is a portable device that records your heartbeat and can help diagnose heart arrhythmias. It allows your health care provider to track your heart activity for several days if needed.  Stress tests by exercise or by giving medicine that makes the heart beat faster. TREATMENT  Treatment of dizziness depends on the cause of your symptoms and can vary greatly. HOME CARE INSTRUCTIONS   Drink enough fluids to keep your urine clear or pale yellow. This is especially important in very hot weather. In older adults, it is also important in cold weather.  Take your medicine exactly as directed if your dizziness is caused by medicines. When taking blood pressure medicines, it is especially important to get up slowly.  Rise slowly from chairs and steady yourself until you feel okay.  In the morning, first sit up on the side of the bed. When you feel okay, stand slowly while holding onto something until you know your balance is fine.  Move your legs often if you need to stand in one place for a long time. Tighten and relax your muscles in your legs while standing.  Have someone stay with you for 1-2 days if dizziness continues to be a problem. Do this until you feel you are well enough to stay alone. Have the person call your health care provider if he or she notices changes in you that are concerning.  Do not drive or use heavy machinery if you feel dizzy.  Do not drink alcohol. SEEK IMMEDIATE MEDICAL CARE IF:   Your dizziness or light-headedness gets worse.  You feel nauseous or vomit.  You have problems talking, walking, or using your arms, hands, or legs.  You feel weak.  You are not thinking clearly or you have trouble forming sentences. It may take a friend or family member to notice this.  You have chest pain, abdominal pain, shortness of breath, or sweating.  Your vision changes.  You notice any bleeding.  You have side effects from  medicine that seems to be getting worse rather than better. MAKE SURE YOU:  Hypertension Hypertension is another name for high blood pressure. High blood pressure forces your heart to work harder to pump blood. A blood pressure reading has two numbers, which includes a higher number over a lower number (example: 110/72). HOME CARE   Have your blood pressure rechecked by your doctor.  Only take medicine as told by your doctor. Follow the directions carefully. The medicine does not work as well if you skip doses. Skipping doses also puts you at risk for problems.  Do not smoke.  Monitor your blood pressure at home as told by your doctor. GET HELP IF:  You  think you are having a reaction to the medicine you are taking.  You have repeat headaches or feel dizzy.  You have puffiness (swelling) in your ankles.  You have trouble with your vision. GET HELP RIGHT AWAY IF:   You get a very bad headache and are confused.  You feel weak, numb, or faint.  You get chest or belly (abdominal) pain.  You throw up (vomit).  You cannot breathe very well. MAKE SURE YOU:   Understand these instructions.  Will watch your condition.  Will get help right away if you are not doing well or get worse. Document Released: 06/29/2007 Document Revised: 01/15/2013 Document Reviewed: 11/02/2012 [facility] Patient Information 2015 [place], [place]. This information is not intended to replace advice given to you by your health care provider. Make sure you discuss any questions you have with your health care provider.   Understand these instructions.  Will watch your condition.  Will get help right away if you are not doing well or get worse. Document Released: 07/06/2000 Document Revised: 01/15/2013 Document Reviewed: 07/30/2010 [facility] Patient Information 2015 [place], [place]. This information is not intended to replace advice given to you by your health care provider. Make sure you discuss any  questions you have with your health care provider.
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
## 2014-02-27 NOTE — ED Notes (Signed)
Reports feeling tired for 2 months.  Intermittent chest pain for 2 weeks.  Pain under left breast, tight.  C/o lightheaded

## 2014-06-22 ENCOUNTER — Encounter (HOSPITAL_COMMUNITY): Payer: Self-pay | Admitting: Emergency Medicine

## 2014-06-22 ENCOUNTER — Emergency Department (HOSPITAL_COMMUNITY)
Admission: EM | Admit: 2014-06-22 | Discharge: 2014-06-22 | Disposition: A | Discharge status: Home / Self Care | Payer: Self-pay | Attending: Emergency Medicine | Admitting: Emergency Medicine

## 2014-06-22 ENCOUNTER — Emergency Department (HOSPITAL_COMMUNITY): Payer: Self-pay

## 2014-06-22 DIAGNOSIS — Z87828 Personal history of other (healed) physical injury and trauma: Secondary | ICD-10-CM | POA: Insufficient documentation

## 2014-06-22 DIAGNOSIS — I10 Essential (primary) hypertension: Secondary | ICD-10-CM | POA: Insufficient documentation

## 2014-06-22 DIAGNOSIS — Z720 Tobacco use: Secondary | ICD-10-CM | POA: Insufficient documentation

## 2014-06-22 DIAGNOSIS — R109 Unspecified abdominal pain: Secondary | ICD-10-CM

## 2014-06-22 DIAGNOSIS — Z8781 Personal history of (healed) traumatic fracture: Secondary | ICD-10-CM | POA: Insufficient documentation

## 2014-06-22 DIAGNOSIS — N200 Calculus of kidney: Secondary | ICD-10-CM | POA: Insufficient documentation

## 2014-06-22 DIAGNOSIS — Z791 Long term (current) use of non-steroidal anti-inflammatories (NSAID): Secondary | ICD-10-CM | POA: Insufficient documentation

## 2014-06-22 LAB — CBC WITH DIFFERENTIAL/PLATELET
BASOS PCT: 0 % (ref 0–1)
Basophils Absolute: 0 10*3/uL (ref 0.0–0.1)
EOS PCT: 2 % (ref 0–5)
Eosinophils Absolute: 0.1 10*3/uL (ref 0.0–0.7)
HCT: 41.6 % (ref 39.0–52.0)
HEMOGLOBIN: 14.7 g/dL (ref 13.0–17.0)
LYMPHS ABS: 2.5 10*3/uL (ref 0.7–4.0)
LYMPHS PCT: 38 % (ref 12–46)
MCH: 26.9 pg (ref 26.0–34.0)
MCHC: 35.3 g/dL (ref 30.0–36.0)
MCV: 76.1 fL — ABNORMAL LOW (ref 78.0–100.0)
Monocytes Absolute: 0.6 10*3/uL (ref 0.1–1.0)
Monocytes Relative: 9 % (ref 3–12)
NEUTROS PCT: 51 % (ref 43–77)
Neutro Abs: 3.4 10*3/uL (ref 1.7–7.7)
Platelets: 270 10*3/uL (ref 150–400)
RBC: 5.47 MIL/uL (ref 4.22–5.81)
RDW: 14 % (ref 11.5–15.5)
WBC: 6.7 10*3/uL (ref 4.0–10.5)

## 2014-06-22 LAB — COMPREHENSIVE METABOLIC PANEL
ALBUMIN: 4.1 g/dL (ref 3.5–5.0)
ALK PHOS: 60 U/L (ref 38–126)
ALT: 32 U/L (ref 17–63)
ANION GAP: 10 (ref 5–15)
AST: 31 U/L (ref 15–41)
BUN: 10 mg/dL (ref 6–20)
CALCIUM: 9.4 mg/dL (ref 8.9–10.3)
CO2: 23 mmol/L (ref 22–32)
CREATININE: 1.02 mg/dL (ref 0.61–1.24)
Chloride: 104 mmol/L (ref 101–111)
GFR calc non Af Amer: >60 mL/min (ref >60)
GLUCOSE: 88 mg/dL (ref 65–99)
Potassium: 3.7 mmol/L (ref 3.5–5.1)
SODIUM: 137 mmol/L (ref 135–145)
TOTAL PROTEIN: 7.4 g/dL (ref 6.5–8.1)
Total Bilirubin: 0.3 mg/dL (ref 0.3–1.2)

## 2014-06-22 LAB — URINALYSIS, ROUTINE W REFLEX MICROSCOPIC
Bilirubin Urine: NEGATIVE
GLUCOSE, UA: NEGATIVE mg/dL
Hgb urine dipstick: NEGATIVE
KETONES UR: NEGATIVE mg/dL
Leukocytes, UA: NEGATIVE
Nitrite: NEGATIVE
Protein, ur: NEGATIVE mg/dL
Specific Gravity, Urine: 1.02 (ref 1.005–1.030)
UROBILINOGEN UA: 0.2 mg/dL (ref 0.0–1.0)
pH: 8 (ref 5.0–8.0)

## 2014-06-22 MED ORDER — HYDROMORPHONE HCL 1 MG/ML IJ SOLN
1.0000 mg | Freq: Once | INTRAMUSCULAR | Status: AC
  Administered 2014-06-22: 1 mg via INTRAVENOUS
  Filled 2014-06-22: qty 1

## 2014-06-22 MED ORDER — DIPHENHYDRAMINE HCL 50 MG/ML IJ SOLN
25.0000 mg | Freq: Once | INTRAMUSCULAR | Status: AC
  Administered 2014-06-22: 25 mg via INTRAVENOUS
  Filled 2014-06-22: qty 1

## 2014-06-22 MED ORDER — NAPROXEN 500 MG PO TABS: 500.0000 mg | ORAL_TABLET | Freq: Two times a day (BID) | ORAL | Status: DC

## 2014-06-22 MED ORDER — TAMSULOSIN HCL 0.4 MG PO CAPS: 0.4000 mg | ORAL_CAPSULE | Freq: Every day | ORAL | Status: AC

## 2014-06-22 MED ORDER — KETOROLAC TROMETHAMINE 30 MG/ML IJ SOLN
30.0000 mg | Freq: Once | INTRAMUSCULAR | Status: AC
  Administered 2014-06-22: 30 mg via INTRAVENOUS
  Filled 2014-06-22: qty 1

## 2014-06-22 MED ORDER — OXYCODONE-ACETAMINOPHEN 5-325 MG PO TABS: 2.0000 | ORAL_TABLET | ORAL | Status: AC | PRN

## 2014-06-22 MED ORDER — ONDANSETRON HCL 4 MG/2ML IJ SOLN
4.0000 mg | Freq: Once | INTRAMUSCULAR | Status: AC
  Administered 2014-06-22: 4 mg via INTRAVENOUS
  Filled 2014-06-22: qty 2

## 2014-06-22 NOTE — ED Notes (Signed)
C/o severe L flank pain that radiates to LLQ x 1 hour.  Denies nausea, vomiting, or urinary complaints.

## 2014-06-22 NOTE — ED Notes (Signed)
Pt removed monitoring equipment.  Unable to remain still in the room due to level of pain.

## 2014-06-22 NOTE — Discharge Instructions (Signed)
Kidney Stones Take naproxen for pain and percocet for breakthrough pain. Follow up with urology.  Kidney stones (urolithiasis) are deposits that form inside your kidneys. The intense pain is caused by the stone moving through the urinary tract. When the stone moves, the ureter goes into spasm around the stone. The stone is usually passed in the urine.  CAUSES   A disorder that makes certain neck glands produce too much parathyroid hormone (primary hyperparathyroidism).  A buildup of uric acid crystals, similar to gout in your joints.  Narrowing (stricture) of the ureter.  A kidney obstruction present at birth (congenital obstruction).  Previous surgery on the kidney or ureters.  Numerous kidney infections. SYMPTOMS   Feeling sick to your stomach (nauseous).  Throwing up (vomiting).  Blood in the urine (hematuria).  Pain that usually spreads (radiates) to the groin.  Frequency or urgency of urination. DIAGNOSIS   Taking a history and physical exam.  Blood or urine tests.  CT scan.  Occasionally, an examination of the inside of the urinary bladder (cystoscopy) is performed. TREATMENT   Observation.  Increasing your fluid intake.  Extracorporeal shock wave lithotripsy--This is a noninvasive procedure that uses shock waves to break up kidney stones.  Surgery may be needed if you have severe pain or persistent obstruction. There are various surgical procedures. Most of the procedures are performed with the use of small instruments. Only small incisions are needed to accommodate these instruments, so recovery time is minimized. The size, location, and chemical composition are all important variables that will determine the proper choice of action for you. Talk to your health care provider to better understand your situation so that you will minimize the risk of injury to yourself and your kidney.  HOME CARE INSTRUCTIONS   Drink enough water and fluids to keep your urine  clear or pale yellow. This will help you to pass the stone or stone fragments.  Strain all urine through the provided strainer. Keep all particulate matter and stones for your health care provider to see. The stone causing the pain may be as small as a grain of salt. It is very important to use the strainer each and every time you pass your urine. The collection of your stone will allow your health care provider to analyze it and verify that a stone has actually passed. The stone analysis will often identify what you can do to reduce the incidence of recurrences.  Only take over-the-counter or prescription medicines for pain, discomfort, or fever as directed by your health care provider.  Make a follow-up appointment with your health care provider as directed.  Get follow-up X-rays if required. The absence of pain does not always mean that the stone has passed. It may have only stopped moving. If the urine remains completely obstructed, it can cause loss of kidney function or even complete destruction of the kidney. It is your responsibility to make sure X-rays and follow-ups are completed. Ultrasounds of the kidney can show blockages and the status of the kidney. Ultrasounds are not associated with any radiation and can be performed easily in a matter of minutes. SEEK MEDICAL CARE IF:  You experience pain that is progressive and unresponsive to any pain medicine you have been prescribed. SEEK IMMEDIATE MEDICAL CARE IF:   Pain cannot be controlled with the prescribed medicine.  You have a fever or shaking chills.  The severity or intensity of pain increases over 18 hours and is not relieved by pain medicine.  You  develop a new onset of abdominal pain.  You feel faint or pass out.  You are unable to urinate. MAKE SURE YOU:   Understand these instructions.  Will watch your condition.  Will get help right away if you are not doing well or get worse. Document Released: 01/10/2005 Document  Revised: 09/12/2012 Document Reviewed: 06/13/2012 [facility] Patient Information 2015 [place], [place]. This information is not intended to replace advice given to you by your health care provider. Make sure you discuss any questions you have with your health care provider.
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
## 2014-06-22 NOTE — ED Notes (Signed)
Pt transported to xray 

## 2014-06-22 NOTE — ED Notes (Signed)
Pt returned from xray

## 2014-07-18 ENCOUNTER — Emergency Department (HOSPITAL_COMMUNITY): Payer: Medicaid Other

## 2014-07-18 ENCOUNTER — Emergency Department (HOSPITAL_COMMUNITY)
Admission: EM | Admit: 2014-07-18 | Discharge: 2014-07-18 | Disposition: A | Discharge status: Home / Self Care | Payer: Medicaid Other | Attending: Emergency Medicine | Admitting: Emergency Medicine

## 2014-07-18 ENCOUNTER — Encounter (HOSPITAL_COMMUNITY): Payer: Self-pay | Admitting: *Deleted

## 2014-07-18 DIAGNOSIS — M25562 Pain in left knee: Secondary | ICD-10-CM

## 2014-07-18 DIAGNOSIS — M25522 Pain in left elbow: Secondary | ICD-10-CM

## 2014-07-18 DIAGNOSIS — Y998 Other external cause status: Secondary | ICD-10-CM | POA: Insufficient documentation

## 2014-07-18 DIAGNOSIS — I10 Essential (primary) hypertension: Secondary | ICD-10-CM | POA: Insufficient documentation

## 2014-07-18 DIAGNOSIS — Y92410 Unspecified street and highway as the place of occurrence of the external cause: Secondary | ICD-10-CM | POA: Insufficient documentation

## 2014-07-18 DIAGNOSIS — Z720 Tobacco use: Secondary | ICD-10-CM | POA: Insufficient documentation

## 2014-07-18 DIAGNOSIS — Z8781 Personal history of (healed) traumatic fracture: Secondary | ICD-10-CM | POA: Insufficient documentation

## 2014-07-18 DIAGNOSIS — S8992XA Unspecified injury of left lower leg, initial encounter: Secondary | ICD-10-CM | POA: Insufficient documentation

## 2014-07-18 DIAGNOSIS — S59902A Unspecified injury of left elbow, initial encounter: Secondary | ICD-10-CM | POA: Insufficient documentation

## 2014-07-18 DIAGNOSIS — Z87828 Personal history of other (healed) physical injury and trauma: Secondary | ICD-10-CM | POA: Insufficient documentation

## 2014-07-18 DIAGNOSIS — Z79899 Other long term (current) drug therapy: Secondary | ICD-10-CM | POA: Insufficient documentation

## 2014-07-18 DIAGNOSIS — Y9389 Activity, other specified: Secondary | ICD-10-CM | POA: Insufficient documentation

## 2014-07-18 MED ORDER — NAPROXEN 500 MG PO TABS: 500.0000 mg | ORAL_TABLET | Freq: Two times a day (BID) | ORAL | Status: AC

## 2014-07-18 NOTE — ED Notes (Signed)
Declined W/C at D/C and was escorted to lobby by RN. 
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
## 2014-11-26 ENCOUNTER — Encounter (HOSPITAL_COMMUNITY): Payer: Self-pay | Admitting: *Deleted

## 2014-11-26 ENCOUNTER — Emergency Department (HOSPITAL_COMMUNITY)
Admission: EM | Admit: 2014-11-26 | Discharge: 2014-11-26 | Disposition: A | Discharge status: Home / Self Care | Payer: Self-pay | Source: Home / Self Care | Attending: Family Medicine | Admitting: Family Medicine

## 2014-11-26 DIAGNOSIS — I10 Essential (primary) hypertension: Secondary | ICD-10-CM

## 2014-11-26 DIAGNOSIS — Z202 Contact with and (suspected) exposure to infections with a predominantly sexual mode of transmission: Secondary | ICD-10-CM

## 2014-11-26 MED ORDER — METRONIDAZOLE 500 MG PO TABS: 500.0000 mg | ORAL_TABLET | Freq: Two times a day (BID) | ORAL | Status: AC

## 2014-11-26 MED ORDER — LISINOPRIL-HYDROCHLOROTHIAZIDE 10-12.5 MG PO TABS: 1.0000 | ORAL_TABLET | Freq: Every day | ORAL | Status: AC

## 2014-11-26 NOTE — ED Notes (Addendum)
Pt  Was  Told  By  His  Partner  That  She  Had    An std  He  denys  Any  Symptoms       Pt  States   Was on   meds for htn      But  Is  Not  Taking  Any  meds  For  Same
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
## 2014-11-26 NOTE — Discharge Instructions (Signed)
Take all of medicine, use condoms during sex.
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
